# Patient Record
Sex: Female | Born: 1985 | Race: Black or African American | Hispanic: No | Marital: Single | Smoking: Never smoker
Health system: Southern US, Community
[De-identification: ages and names within clinical notes are randomized; demographics above are authoritative.]

## PROBLEM LIST (undated history)

## (undated) DIAGNOSIS — IMO0001 Reserved for inherently not codable concepts without codable children: Secondary | ICD-10-CM

## (undated) DIAGNOSIS — I2699 Other pulmonary embolism without acute cor pulmonale: Secondary | ICD-10-CM

## (undated) DIAGNOSIS — I82411 Acute embolism and thrombosis of right femoral vein: Secondary | ICD-10-CM

## (undated) HISTORY — PX: WISDOM TOOTH EXTRACTION: SHX21

---
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
## 2007-10-14 ENCOUNTER — Emergency Department: Payer: Self-pay | Admitting: Emergency Medicine

## 2008-05-04 ENCOUNTER — Emergency Department (HOSPITAL_COMMUNITY): Admission: EM | Admit: 2008-05-04 | Discharge: 2008-05-04 | Payer: Self-pay | Admitting: Emergency Medicine

## 2008-05-28 ENCOUNTER — Emergency Department (HOSPITAL_COMMUNITY): Admission: EM | Admit: 2008-05-28 | Discharge: 2008-05-28 | Payer: Self-pay | Admitting: Family Medicine

## 2008-10-01 ENCOUNTER — Emergency Department: Payer: Self-pay | Admitting: Emergency Medicine

## 2009-06-11 ENCOUNTER — Emergency Department (HOSPITAL_COMMUNITY): Admission: EM | Admit: 2009-06-11 | Discharge: 2009-06-11 | Payer: Self-pay | Admitting: Emergency Medicine

## 2010-05-29 LAB — CBC
MCHC: 34 g/dL (ref 30.0–36.0)
MCV: 99.2 fL (ref 78.0–100.0)
Platelets: 292 10*3/uL (ref 150–400)
RDW: 12.9 % (ref 11.5–15.5)

## 2010-05-29 LAB — LIPASE, BLOOD: Lipase: 26 U/L (ref 11–59)

## 2010-05-29 LAB — COMPREHENSIVE METABOLIC PANEL
AST: 18 U/L (ref 0–37)
Albumin: 3.2 g/dL — ABNORMAL LOW (ref 3.5–5.2)
CO2: 25 mEq/L (ref 19–32)
Calcium: 8.9 mg/dL (ref 8.4–10.5)
Creatinine, Ser: 0.82 mg/dL (ref 0.4–1.2)
GFR calc Af Amer: >60 mL/min (ref >60)
GFR calc non Af Amer: >60 mL/min (ref >60)
Sodium: 136 mEq/L (ref 135–145)
Total Protein: 7.1 g/dL (ref 6.0–8.3)

## 2010-05-29 LAB — URINALYSIS, ROUTINE W REFLEX MICROSCOPIC
Bilirubin Urine: NEGATIVE
Glucose, UA: NEGATIVE mg/dL
Nitrite: NEGATIVE
Specific Gravity, Urine: 1.025 (ref 1.005–1.030)
pH: 8 (ref 5.0–8.0)

## 2010-05-29 LAB — DIFFERENTIAL
Eosinophils Relative: 3 % (ref 0–5)
Lymphocytes Relative: 41 % (ref 12–46)
Lymphs Abs: 2.4 10*3/uL (ref 0.7–4.0)
Monocytes Relative: 7 % (ref 3–12)

## 2011-02-12 ENCOUNTER — Emergency Department: Payer: Self-pay

## 2011-05-13 ENCOUNTER — Emergency Department (INDEPENDENT_AMBULATORY_CARE_PROVIDER_SITE_OTHER)

## 2011-05-13 ENCOUNTER — Encounter (HOSPITAL_COMMUNITY): Payer: Self-pay | Admitting: Emergency Medicine

## 2011-05-13 ENCOUNTER — Emergency Department (INDEPENDENT_AMBULATORY_CARE_PROVIDER_SITE_OTHER)
Admission: EM | Admit: 2011-05-13 | Discharge: 2011-05-13 | Disposition: A | Discharge status: Home / Self Care | Source: Home / Self Care | Attending: Family Medicine | Admitting: Family Medicine

## 2011-05-13 DIAGNOSIS — M940 Chondrocostal junction syndrome [Tietze]: Secondary | ICD-10-CM

## 2011-05-13 LAB — POCT URINALYSIS DIP (DEVICE)
Bilirubin Urine: NEGATIVE
Glucose, UA: NEGATIVE mg/dL
Leukocytes, UA: NEGATIVE
Nitrite: NEGATIVE

## 2011-05-13 MED ORDER — NAPROXEN 500 MG PO TABS
500.0000 mg | ORAL_TABLET | Freq: Two times a day (BID) | ORAL | Status: DC
Start: 2011-05-13 — End: 2011-11-17

## 2011-05-13 MED ORDER — HYDROCODONE-ACETAMINOPHEN 5-325 MG PO TABS: ORAL_TABLET | ORAL | Status: AC

## 2011-05-13 NOTE — Discharge Instructions (Signed)
Your x-ray was negative for any acute findings. This is likely an inflammatory change in your rib cage. Take medications as directed. May use mild heat as well. Return to care should your symptoms not improve, or worsen in any way, such as fever, shortness of breath, worsening pain.

## 2011-05-13 NOTE — ED Notes (Signed)
Pain lower part of rib cage, on right .  Denies cough, denies injury.  Reported as a sharp pain.

## 2011-05-13 NOTE — ED Provider Notes (Signed)
History     CSN: [id number]  Arrival date & time 05/13/11  1526   First MD Initiated Contact with Patient 05/13/11 1638      Chief Complaint  Patient presents with  . Chest Pain    (Consider location/radiation/quality/duration/timing/severity/associated sxs/prior treatment) HPI Comments: Nashly presents for evaluation of right-sided lower rib cage pain. She denies any specific injury, did not herself did not fall. She also reports wrist into the pain as an appointment next week. For this. She denies any cough, fever. She denies any urinary symptoms. No dysuria. No hematuria. No frequency, or urgency. She, reports. She's been drinking lots of water, and cranberry juice in case. This was her kidneys.  Patient is a 26 y.o. female presenting with chest pain. The history is provided by the patient.  Chest Pain The chest pain began 1 - 2 weeks ago. Chest pain occurs constantly. The chest pain is unchanged. The pain is associated with breathing, lifting, coughing and exertion. The quality of the pain is described as pleuritic and sharp. The pain does not radiate. Chest pain is worsened by exertion and deep breathing. Pertinent negatives for primary symptoms include no fever, no fatigue, no cough, no wheezing, no palpitations, no nausea and no vomiting.     History reviewed. No pertinent past medical history.  History reviewed. No pertinent past surgical history.  History reviewed. No pertinent family history.  History  Substance Use Topics  . Smoking status: Never Smoker   . Smokeless tobacco: Not on file  . Alcohol Use: Yes    OB History    Grav Para Term Preterm Abortions TAB SAB Ect Mult Living                  Review of Systems  Constitutional: Negative.  Negative for fever and fatigue.  HENT: Positive for dental problem.        Wisdom teeth erupting  Eyes: Negative.   Respiratory: Negative.  Negative for cough and wheezing.   Cardiovascular: Negative for palpitations.       Rib pain  Gastrointestinal: Negative.  Negative for nausea and vomiting.  Genitourinary: Negative.   Musculoskeletal: Negative.   Skin: Negative.   Neurological: Negative.     Allergies  Triaminic  Home Medications   Current Outpatient Rx  Name Route Sig Dispense Refill  . IBUPROFEN 800 MG PO TABS Oral Take 800 mg by mouth every 8 (eight) hours as needed.    Colleen Can FE 1/20 PO Oral Take by mouth.    Marland Kitchen HYDROCODONE-ACETAMINOPHEN 5-325 MG PO TABS  Take one to two tablets every 4 to 6 hours as needed for pain 20 tablet 0  . NAPROXEN 500 MG PO TABS Oral Take 1 tablet (500 mg total) by mouth 2 (two) times daily. 30 tablet 0    BP 122/77  Pulse 66  Temp(Src) 98.6 F (37 C) (Oral)  Resp 16  SpO2 98%  LMP 05/12/2011  Physical Exam  Nursing note and vitals reviewed. Constitutional: She is oriented to person, place, and time. She appears well-developed and well-nourished.  HENT:  Head: Normocephalic and atraumatic.  Mouth/Throat: Uvula is midline, oropharynx is clear and moist and mucous membranes are normal.    Eyes: EOM are normal.  Neck: Normal range of motion.  Pulmonary/Chest: Effort normal and breath sounds normal. She has no decreased breath sounds. She has no wheezes. She has no rhonchi. She exhibits tenderness and bony tenderness.    Abdominal: Soft. Normal appearance and bowel  sounds are normal. There is no tenderness. There is no guarding and no CVA tenderness.  Musculoskeletal: Normal range of motion.  Neurological: She is alert and oriented to person, place, and time.  Skin: Skin is warm and dry.  Psychiatric: Her behavior is normal.    ED Course  Procedures (including critical care time)  Labs Reviewed  POCT URINALYSIS DIP (DEVICE) - Abnormal; Notable for the following:    Hgb urine dipstick LARGE (*)    All other components within normal limits  POCT PREGNANCY, URINE   Dg Ribs Unilateral W/chest Right  05/13/2011  *RADIOLOGY REPORT*  Clinical Data:  Right anterior rib and chest pain  RIGHT RIBS AND CHEST - 3+ VIEW  Comparison: None.  Findings: Normal heart size and vascularity.  Negative for pneumonia, collapse, consolidation, effusion, pneumothorax. Trachea midline.  Visualized ribs intact.  No displaced fracture or focal rib abnormality in the region of the radiopaque marker.  IMPRESSION: No acute finding.  Original Report Authenticated By: Judie Petit. Ruel Favors, M.D.     1. Costochondritis       MDM  Xray reviewed by radiologist and myself; negative; naproxen and hydrocodone PRN; return if no improvement        Richardo Priest, MD 05/13/11 1914

## 2011-11-17 ENCOUNTER — Emergency Department (INDEPENDENT_AMBULATORY_CARE_PROVIDER_SITE_OTHER)

## 2011-11-17 ENCOUNTER — Emergency Department (INDEPENDENT_AMBULATORY_CARE_PROVIDER_SITE_OTHER)
Admission: EM | Admit: 2011-11-17 | Discharge: 2011-11-17 | Disposition: A | Discharge status: Home / Self Care | Source: Home / Self Care | Attending: Emergency Medicine | Admitting: Emergency Medicine

## 2011-11-17 ENCOUNTER — Encounter (HOSPITAL_COMMUNITY): Payer: Self-pay | Admitting: *Deleted

## 2011-11-17 DIAGNOSIS — S93401A Sprain of unspecified ligament of right ankle, initial encounter: Secondary | ICD-10-CM

## 2011-11-17 DIAGNOSIS — S93409A Sprain of unspecified ligament of unspecified ankle, initial encounter: Secondary | ICD-10-CM

## 2011-11-17 MED ORDER — NAPROXEN 500 MG PO TABS: 500.0000 mg | ORAL_TABLET | Freq: Two times a day (BID) | ORAL | Status: AC

## 2011-11-17 MED ORDER — HYDROCODONE-ACETAMINOPHEN 5-325 MG PO TABS: 2.0000 | ORAL_TABLET | ORAL | Status: AC | PRN

## 2011-11-17 NOTE — ED Notes (Signed)
Pt  Reports  She  Injured  Her  r  Ankle  About  1  Week  Ago  She  Reports     She  Larey Seat   And  Perhaps  Twisted  The  Ankle   -  She  Reports  Pain on  Weight  Bearing         She  Reports  She  Has been  Applying ice  To  The  Affected  Area

## 2011-11-17 NOTE — ED Provider Notes (Signed)
History     CSN: [id number]  Arrival date & time 11/17/11  1430   First MD Initiated Contact with Patient 11/17/11 1432      Chief Complaint  Patient presents with  . Ankle Pain    (Consider location/radiation/quality/duration/timing/severity/associated sxs/prior treatment) HPI Comments: Patient states that she fell down the stairs at a week ago, rolling her right ankle outward. Do not hear a "pop". Reports mild lateral swelling. Which was elevating, going ice with improvement, but was required to run 2 miles yesterday for PT, and reports returned worsening pain lateral ankle and anterior foot. No nausea, vomiting, discoloration. No previous history of injury to his foot. Patient is not a smoker.  ROS as noted in HPI. All other ROS negative.   Patient is a 26 y.o. female presenting with ankle pain. The history is provided by the patient. No language interpreter was used.  Ankle Pain  The incident occurred at home. The injury mechanism was a fall. The pain is present in the left ankle. The quality of the pain is described as throbbing and aching. Associated symptoms include inability to bear weight. Pertinent negatives include no numbness, no loss of motion, no muscle weakness and no loss of sensation. She reports no foreign bodies present. The symptoms are aggravated by activity, bearing weight and palpation. She has tried rest for the symptoms. The treatment provided mild relief.    History reviewed. No pertinent past medical history.  History reviewed. No pertinent past surgical history.  History reviewed. No pertinent family history.  History  Substance Use Topics  . Smoking status: Never Smoker   . Smokeless tobacco: Not on file  . Alcohol Use: Yes    OB History    Grav Para Term Preterm Abortions TAB SAB Ect Mult Living                  Review of Systems  Neurological: Negative for numbness.    Allergies  Triaminic  Home Medications   Current Outpatient Rx    Name Route Sig Dispense Refill  . HYDROCODONE-ACETAMINOPHEN 5-325 MG PO TABS Oral Take 2 tablets by mouth every 4 (four) hours as needed for pain. 20 tablet 0  . NAPROXEN 500 MG PO TABS Oral Take 1 tablet (500 mg total) by mouth 2 (two) times daily. 20 tablet 0  . JUNEL FE 1/20 PO Oral Take by mouth.      BP 115/69  Pulse 69  Temp 98.1 F (36.7 C) (Oral)  Resp 20  SpO2 100%  LMP 11/05/2011  Physical Exam  Nursing note and vitals reviewed. Constitutional: She is oriented to person, place, and time. She appears well-developed and well-nourished. No distress.  HENT:  Head: Normocephalic and atraumatic.  Eyes: Conjunctivae and EOM are normal.  Neck: Normal range of motion.  Cardiovascular: Normal rate.   Pulmonary/Chest: Effort normal.  Abdominal: She exhibits no distension.  Musculoskeletal: Normal range of motion.       R lateral ligaments tender, ATFL tender, mild lateral ST S. Distal fibula NT , Medial malleolus NT,  Deltoid ligament NT ,Achilles NT, Proximal fibula NT, Proximal 5th metatarsal NT, Midfoot NT, distal NVI with baseline sensation / motor to foot with CR<2 seconds.   Neurological: She is alert and oriented to person, place, and time. Coordination normal.  Skin: Skin is warm and dry.  Psychiatric: She has a normal mood and affect. Her behavior is normal. Judgment and thought content normal.    ED Course  Procedures (  including critical care time)  Labs Reviewed - No data to display Dg Ankle Complete Right  11/17/2011  *RADIOLOGY REPORT*  Clinical Data: Recent fall with pain  RIGHT ANKLE - COMPLETE 3+ VIEW  Comparison: None.  Findings: The ankle joint appears normal.  Alignment is normal.  No fracture is seen.  IMPRESSION: Negative.   Original Report Authenticated By: Juline Patch, M.D.    Dg Foot Complete Right  11/17/2011  *RADIOLOGY REPORT*  Clinical Data: Larey Seat and injured right foot 1 week ago, persistent dorsal pain.  RIGHT FOOT COMPLETE - 3+ VIEW  Comparison:  None.  Findings: No evidence of acute or subacute fracture or dislocation. Well-preserved joint spaces.  Likely developmental irregularity involving the distal tuft of the distal phalanx of the great toe. Well-preserved bone mineral density.  No significant intrinsic osseous abnormalities.  IMPRESSION: No acute, subacute, or significant abnormalities.   Original Report Authenticated By: Arnell Sieving, M.D.      1. Right ankle sprain     MDM  I Imaging reviewed by myself. Report per radiologist. Applied ASO, crutches WBAT, instructed pt on ice, nsaid/ norco prn, and f/u with [facility] sports medicine clinic in 10 days if no improvement.     Luiz Blare, MD 11/17/11 [id number]

## 2012-01-05 ENCOUNTER — Encounter (HOSPITAL_COMMUNITY): Payer: Self-pay | Admitting: *Deleted

## 2012-01-05 ENCOUNTER — Emergency Department (HOSPITAL_COMMUNITY)
Admission: EM | Admit: 2012-01-05 | Discharge: 2012-01-05 | Disposition: A | Discharge status: Home / Self Care | Attending: Emergency Medicine | Admitting: Emergency Medicine

## 2012-01-05 DIAGNOSIS — F329 Major depressive disorder, single episode, unspecified: Secondary | ICD-10-CM | POA: Insufficient documentation

## 2012-01-05 DIAGNOSIS — Z79899 Other long term (current) drug therapy: Secondary | ICD-10-CM | POA: Insufficient documentation

## 2012-01-05 DIAGNOSIS — F3289 Other specified depressive episodes: Secondary | ICD-10-CM | POA: Insufficient documentation

## 2012-01-05 DIAGNOSIS — F32A Depression, unspecified: Secondary | ICD-10-CM

## 2012-01-05 DIAGNOSIS — R45851 Suicidal ideations: Secondary | ICD-10-CM | POA: Insufficient documentation

## 2012-01-05 LAB — RAPID URINE DRUG SCREEN, HOSP PERFORMED
Cocaine: NOT DETECTED
Opiates: NOT DETECTED

## 2012-01-05 MED ORDER — IBUPROFEN 100 MG/5ML PO SUSP: 600.0000 mg | Freq: Once | ORAL | Status: DC

## 2012-01-05 MED ORDER — ACETAMINOPHEN 325 MG PO TABS
650.0000 mg | ORAL_TABLET | ORAL | Status: DC | PRN
Start: 2012-01-05 — End: 2012-01-06

## 2012-01-05 MED ORDER — IBUPROFEN 200 MG PO TABS: 600.0000 mg | ORAL_TABLET | Freq: Three times a day (TID) | ORAL | Status: DC | PRN

## 2012-01-05 MED ORDER — ONDANSETRON HCL 4 MG PO TABS: 4.0000 mg | ORAL_TABLET | Freq: Three times a day (TID) | ORAL | Status: DC | PRN

## 2012-01-05 MED ORDER — ALUM & MAG HYDROXIDE-SIMETH 200-200-20 MG/5ML PO SUSP: 30.0000 mL | ORAL | Status: DC | PRN

## 2012-01-05 MED ORDER — IBUPROFEN 200 MG PO TABS
600.0000 mg | ORAL_TABLET | Freq: Once | ORAL | Status: AC
  Administered 2012-01-05: 600 mg via ORAL
  Filled 2012-01-05: qty 3

## 2012-01-05 NOTE — ED Provider Notes (Signed)
History     CSN: [id number]  Arrival date & time 01/05/12  1458   First MD Initiated Contact with Patient 01/05/12 1535      Chief Complaint  Patient presents with  . Medical Clearance  . Depression    (Consider location/radiation/quality/duration/timing/severity/associated sxs/prior treatment) The history is provided by the patient.   patient presents with some depression and some mild suicidal thoughts. She states his been going on for a few weeks. It started after she hurt her ankle and has been in a boot. She states she feels all alone. She states she's had some suicidal thoughts but would not know how to do it. It also started after she had been on pain medicine and also after she had a steroid shot into her foot. No previous depression history. No substance abuse. She denies possibility of pregnancy.  History reviewed. No pertinent past medical history.  History reviewed. No pertinent past surgical history.  No family history on file.  History  Substance Use Topics  . Smoking status: Never Smoker   . Smokeless tobacco: Not on file  . Alcohol Use: Yes    OB History    Grav Para Term Preterm Abortions TAB SAB Ect Mult Living                  Review of Systems  Constitutional: Negative for activity change and appetite change.  HENT: Negative for neck stiffness.   Eyes: Negative for pain.  Respiratory: Negative for chest tightness and shortness of breath.   Cardiovascular: Negative for chest pain and leg swelling.  Gastrointestinal: Negative for nausea, vomiting, abdominal pain and diarrhea.  Genitourinary: Negative for flank pain.  Musculoskeletal: Negative for back pain.  Skin: Negative for rash.  Neurological: Negative for weakness, numbness and headaches.  Psychiatric/Behavioral: Positive for suicidal ideas and dysphoric mood. Negative for behavioral problems.    Allergies  Triaminic  Home Medications   Current Outpatient Rx  Name Route Sig Dispense  Refill  . HYDROCODONE-ACETAMINOPHEN 5-325 MG PO TABS Oral Take 1 tablet by mouth every 6 (six) hours as needed. pain    . NAPROXEN 500 MG PO TABS Oral Take 1 tablet (500 mg total) by mouth 2 (two) times daily. 20 tablet 0  . TRAMADOL HCL 50 MG PO TABS Oral Take 100 mg by mouth every 6 (six) hours as needed. pain    . JUNEL FE 1/20 PO Oral Take by mouth.      BP 124/81  Pulse 77  Temp 98.2 F (36.8 C) (Oral)  Resp 20  SpO2 96%  Physical Exam  Nursing note and vitals reviewed. Constitutional: She is oriented to person, place, and time. She appears well-developed and well-nourished.  HENT:  Head: Normocephalic and atraumatic.  Cardiovascular: Normal rate, regular rhythm and normal heart sounds.   No murmur heard. Pulmonary/Chest: Effort normal and breath sounds normal. No respiratory distress. She has no wheezes. She has no rales.  Abdominal: Soft. Bowel sounds are normal. She exhibits no distension. There is no tenderness. There is no rebound and no guarding.  Musculoskeletal:       Right lower leg in walking boot.  Neurological: She is alert and oriented to person, place, and time. No cranial nerve deficit.  Skin: Skin is warm and dry.  Psychiatric: She has a normal mood and affect. Her speech is normal.    ED Course  Procedures (including critical care time)   Labs Reviewed  URINE RAPID DRUG SCREEN (HOSP PERFORMED)  PREGNANCY, URINE  ETHANOL   No results found.   1. Depression       MDM  Patient presents with new onset depression. Not actively suicidal. Patient has had a telepsych consult and patient has been cleared for discharge. She was given followup resources by the ACT team.        Juliet Rude. Rubin Payor, MD 01/05/12 2013

## 2012-01-05 NOTE — ED Notes (Signed)
Telepsych in progress. 

## 2012-01-05 NOTE — ED Notes (Signed)
Pt's father at bedside

## 2012-01-05 NOTE — ED Notes (Signed)
Multimedia programmer.

## 2012-01-05 NOTE — ED Notes (Signed)
Telepsych paperwork initiated.

## 2012-01-05 NOTE — ED Notes (Signed)
Pt reports feeling very alone over last few weeks. Fractured R ankle and has been in a boot x1 month. Feels angry, sad, frustrated. Sts her father checks on her, but her mother does not live around here. No other specific triggers for her depression. Pt brought in voluntarily by [employer].

## 2012-12-17 ENCOUNTER — Encounter (HOSPITAL_COMMUNITY): Payer: Self-pay | Admitting: Emergency Medicine

## 2012-12-17 DIAGNOSIS — R1012 Left upper quadrant pain: Secondary | ICD-10-CM | POA: Insufficient documentation

## 2012-12-17 DIAGNOSIS — Z79899 Other long term (current) drug therapy: Secondary | ICD-10-CM | POA: Insufficient documentation

## 2012-12-17 DIAGNOSIS — M25519 Pain in unspecified shoulder: Secondary | ICD-10-CM | POA: Insufficient documentation

## 2012-12-17 DIAGNOSIS — Z3202 Encounter for pregnancy test, result negative: Secondary | ICD-10-CM | POA: Insufficient documentation

## 2012-12-17 LAB — CBC WITH DIFFERENTIAL/PLATELET
Basophils Absolute: 0 10*3/uL (ref 0.0–0.1)
Basophils Relative: 0 % (ref 0–1)
Eosinophils Absolute: 0.1 10*3/uL (ref 0.0–0.7)
Eosinophils Relative: 1 % (ref 0–5)
HCT: 37.8 % (ref 36.0–46.0)
MCHC: 36.2 g/dL — ABNORMAL HIGH (ref 30.0–36.0)
MCV: 93.3 fL (ref 78.0–100.0)
Monocytes Absolute: 0.5 10*3/uL (ref 0.1–1.0)
RDW: 12.7 % (ref 11.5–15.5)

## 2012-12-17 LAB — COMPREHENSIVE METABOLIC PANEL
ALT: 11 U/L (ref 0–35)
Albumin: 3.5 g/dL (ref 3.5–5.2)
Alkaline Phosphatase: 39 U/L (ref 39–117)
Glucose, Bld: 79 mg/dL (ref 70–99)
Potassium: 4 mEq/L (ref 3.5–5.1)
Sodium: 138 mEq/L (ref 135–145)
Total Protein: 7.9 g/dL (ref 6.0–8.3)

## 2012-12-17 NOTE — ED Notes (Signed)
Presents with 2-3 week of left upper quadrant pain descriebd as sharp with radiation to right shoulder. Pain was intermittent but has become constant. Denies nausea, denies chest pain.

## 2012-12-18 ENCOUNTER — Emergency Department (HOSPITAL_COMMUNITY)
Admission: EM | Admit: 2012-12-18 | Discharge: 2012-12-18 | Disposition: A | Discharge status: Home / Self Care | Attending: Emergency Medicine | Admitting: Emergency Medicine

## 2012-12-18 ENCOUNTER — Emergency Department (HOSPITAL_COMMUNITY)

## 2012-12-18 DIAGNOSIS — M25511 Pain in right shoulder: Secondary | ICD-10-CM

## 2012-12-18 DIAGNOSIS — R109 Unspecified abdominal pain: Secondary | ICD-10-CM

## 2012-12-18 LAB — URINALYSIS, ROUTINE W REFLEX MICROSCOPIC
Bilirubin Urine: NEGATIVE
Ketones, ur: 15 mg/dL — AB
Nitrite: NEGATIVE
Urobilinogen, UA: 0.2 mg/dL (ref 0.0–1.0)

## 2012-12-18 MED ORDER — IOHEXOL 300 MG/ML  SOLN
100.0000 mL | Freq: Once | INTRAMUSCULAR | Status: AC | PRN
  Administered 2012-12-18: 100 mL via INTRAVENOUS

## 2012-12-18 MED ORDER — IOHEXOL 300 MG/ML  SOLN
25.0000 mL | Freq: Once | INTRAMUSCULAR | Status: AC | PRN
  Administered 2012-12-18: 25 mL via ORAL

## 2012-12-18 MED ORDER — MORPHINE SULFATE 4 MG/ML IJ SOLN
4.0000 mg | Freq: Once | INTRAMUSCULAR | Status: AC
  Administered 2012-12-18: 4 mg via INTRAVENOUS
  Filled 2012-12-18: qty 1

## 2012-12-18 MED ORDER — HYDROCODONE-ACETAMINOPHEN 5-325 MG PO TABS: 1.0000 | ORAL_TABLET | Freq: Four times a day (QID) | ORAL | Status: DC | PRN

## 2012-12-18 MED ORDER — SODIUM CHLORIDE 0.9 % IV BOLUS (SEPSIS)
1000.0000 mL | Freq: Once | INTRAVENOUS | Status: AC
  Administered 2012-12-18: 1000 mL via INTRAVENOUS

## 2012-12-18 MED ORDER — IBUPROFEN 400 MG PO TABS: 400.0000 mg | ORAL_TABLET | Freq: Four times a day (QID) | ORAL | Status: DC | PRN

## 2012-12-18 MED ORDER — ONDANSETRON 8 MG PO TBDP: 8.0000 mg | ORAL_TABLET | Freq: Three times a day (TID) | ORAL | Status: DC | PRN

## 2012-12-18 MED ORDER — ONDANSETRON HCL 4 MG/2ML IJ SOLN
4.0000 mg | Freq: Once | INTRAMUSCULAR | Status: AC
  Administered 2012-12-18: 4 mg via INTRAVENOUS
  Filled 2012-12-18: qty 2

## 2012-12-18 NOTE — ED Notes (Signed)
Swollen abdomen to LUQ, pt denies nausea at this time. States she has LUQ pain and R shoulder pain. LUQ edema noted upon palpation.

## 2012-12-18 NOTE — ED Provider Notes (Signed)
CSN: [id number]     Arrival date & time 12/17/12  2043 History   First MD Initiated Contact with Patient 12/18/12 0049     Chief Complaint  Patient presents with  . Abdominal Pain   (Consider location/radiation/quality/duration/timing/severity/associated sxs/prior Treatment) HPI Comments: Pt comes in with cc of abd pain, shoulder pain. 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 She comes in with 2-3 weeks of left sided abd pain and about 3 days hx of shoulder pain. The left sided abd pain is sharp, non radiating. There is no uti like sx, no hx of renal stones, no vaginal discharge, bleeding, no hx of pelbic disordered. Pt has no hx of similar pain. She denies any cough. Pt also has right shoulder pain. Pain is sharp, worse with movement. No cough. No hx of PE, DVT and no risk factors for the same. No trauma. Pt has no hx of ivda. Able to move the shoulder, just has pain.  Patient is a 27 y.o. female presenting with abdominal pain. The history is provided by the patient.  Abdominal Pain Associated symptoms: no chest pain, no chills, no constipation, no cough, no diarrhea, no fever, no hematuria, no nausea, no shortness of breath and no vomiting     History reviewed. No pertinent past medical history. History reviewed. No pertinent past surgical history. History reviewed. No pertinent family history. History  Substance Use Topics  . Smoking status: Never Smoker   . Smokeless tobacco: Not on file  . Alcohol Use: Yes   OB History   Grav Para Term Preterm Abortions TAB SAB Ect Mult Living                 Review of Systems  Constitutional: Negative for fever, chills and activity change.  HENT: Negative for facial swelling.   Respiratory: Negative for cough, shortness of breath and wheezing.   Cardiovascular: Negative for chest pain.  Gastrointestinal: Positive for abdominal pain. Negative for nausea, vomiting, diarrhea,  constipation, blood in stool and abdominal distention.  Genitourinary: Negative for hematuria and difficulty urinating.  Musculoskeletal: Positive for arthralgias. Negative for joint swelling and neck pain.  Skin: Negative for color change, rash and wound.  Neurological: Negative for speech difficulty.  Hematological: Does not bruise/bleed easily.  Psychiatric/Behavioral: Negative for confusion.    Allergies  Triaminic  Home Medications   Current Outpatient Rx  Name  Route  Sig  Dispense  Refill  . Norethin Ace-Eth Estrad-FE (JUNEL FE 1/20 PO)   Oral   Take 1 tablet by mouth daily.           BP 101/58  Pulse 61  Temp(Src) 98.4 F (36.9 C) (Oral)  Resp 16  SpO2 99%  LMP 12/11/2012 Physical Exam  Nursing note and vitals reviewed. Constitutional: She is oriented to person, place, and time. She appears well-developed and well-nourished.  HENT:  Head: Normocephalic and atraumatic.  Eyes: EOM are normal. Pupils are equal, round, and reactive to light.  Neck: Neck supple.  Cardiovascular: Normal rate, regular rhythm and normal heart sounds.   No murmur heard. Pulmonary/Chest: Effort normal. No respiratory distress.  Abdominal: Soft. She exhibits no distension. There is tenderness. There is no rebound and no guarding.  LUQ tenderness, + guarding, no rebound  Musculoskeletal:  No left shoulder swelling ,eryrthema, callor. Able to abduct and strength is 4+/5  Neurological: She is alert and oriented to person, place, and time.  Skin: Skin is warm and dry.    ED Course  Procedures (including critical care time) Labs Review Labs Reviewed  COMPREHENSIVE METABOLIC PANEL - Abnormal; Notable for the following:    GFR calc non Af Amer 76 (*)    GFR calc Af Amer 88 (*)    All other components within normal limits  CBC WITH DIFFERENTIAL - Abnormal; Notable for the following:    MCHC 36.2 (*)    All other components within normal limits  URINALYSIS, ROUTINE W REFLEX MICROSCOPIC  - Abnormal; Notable for the following:    Color, Urine AMBER (*)    APPearance CLOUDY (*)    Specific Gravity, Urine 1.034 (*)    Ketones, ur 15 (*)    All other components within normal limits  LIPASE, BLOOD  POCT PREGNANCY, URINE   Imaging Review Dg Chest 1 View  12/18/2012   CLINICAL DATA:  Shoulder and abdominal pain.  EXAM: CHEST - 1 VIEW  COMPARISON:  05/13/2011.  FINDINGS: Normal sized heart. Clear lungs. Stable mild scoliosis. Excreted contrast in the renal collecting systems.  IMPRESSION: No acute abnormality.   Electronically Signed   By: Gordan Payment M.D.   On: 12/18/2012 02:57   Dg Shoulder Right  12/18/2012   CLINICAL DATA:  Right posterior shoulder pain.  No known injury.  EXAM: RIGHT SHOULDER - 2+ VIEW  COMPARISON:  None.  FINDINGS: There is no evidence of fracture or dislocation. There is no evidence of arthropathy or other focal bone abnormality. Soft tissues are unremarkable.  IMPRESSION: Normal examination.   Electronically Signed   By: Gordan Payment M.D.   On: 12/18/2012 02:58   Ct Abdomen Pelvis W Contrast  12/18/2012   *RADIOLOGY REPORT*  Clinical Data: 2-3 weeks of intermittent left upper quadrant pain.  CT ABDOMEN AND PELVIS WITH CONTRAST  Technique:  Multidetector CT imaging of the abdomen and pelvis was performed following the standard protocol during bolus administration of intravenous contrast.  Contrast: OMNIPAQUE IOHEXOL 300 MG/ML  SOLN  Comparison: None.  Findings: The visualized lung bases are clear.  The liver demonstrates a normal contrast enhanced appearance.  The gallbladder is within normal limits.  The spleen, adrenal glands, and pancreas are unremarkable.  The kidneys are symmetric in size with symmetric enhancement.  No hydronephrosis, nephrolithiasis, or focal renal mass.  The stomach is unremarkable.  There is no evidence of bowel obstruction. The visualized appendix is of normal caliber and appearance without associate inflammatory changes to suggest  acute appendicitis.  No abnormal wall thickening or enhancement is seen about the bowels to suggest underlying inflammation.  A moderate amount retained stool seen diffusely throughout the colon.  Bladder is partially decompressed but grossly normal.  Uterus and ovaries are within normal limits for patient age.  Small amount of free fluid is seen within the right pelvic cul-de- sac, which demonstrates a density of 15 HU, likely physiologic.  No free intraperitoneal air identified.  No pathologically enlarged intra-abdominal or pelvic lymph nodes are seen.  Normal intravascular enhancement is seen throughout the abdomen and pelvis.  No acute osseous abnormality identified.  No focal osseous lesions.  IMPRESSION:  1.  No CT evidence of acute intra-abdominal or pelvic process identified. 2.  Small volume free fluid within the pelvis, likely physiologic.   Original Report Authenticated By: Rise Mu, M.D.    EKG Interpretation   None       MDM  No diagnosis found.  Pt comes in with cc  of Left sided abd pain x 3 weeks and right shoulder pain x 3 days.  She mentioned in triage that the abd pain radiates to the shoulder, but tells me that they are 2 separate  Pains. She has no medical hx, no ivda.  Shoulder exam reveals no signs of infection - swelling, redness, warmth - and more importantly she is able to move her shoulder quite well - just with mild pain.  Abd pain is severe on palpation. CT abd ordered, and is neg.  Pain x 3 weeks, doubt torsion. No splenomegaly, no renal stones, no infections.  Spoke with [facility], no large cyst seen either.  Pt hasn o lower quadrant pain, and so no indication for US pelvis or pelvic exam at this time.  Results discussed with the patient. Will d.c  She has insurance, pcp f/u will be provided.     Derwood Kaplan, MD 12/18/12 [phone]

## 2013-06-28 ENCOUNTER — Encounter (HOSPITAL_COMMUNITY): Payer: Self-pay | Admitting: Emergency Medicine

## 2013-06-28 ENCOUNTER — Emergency Department (HOSPITAL_COMMUNITY)

## 2013-06-28 ENCOUNTER — Emergency Department (HOSPITAL_COMMUNITY)
Admission: EM | Admit: 2013-06-28 | Discharge: 2013-06-28 | Disposition: A | Discharge status: Home / Self Care | Attending: Emergency Medicine | Admitting: Emergency Medicine

## 2013-06-28 DIAGNOSIS — Y939 Activity, unspecified: Secondary | ICD-10-CM | POA: Insufficient documentation

## 2013-06-28 DIAGNOSIS — M546 Pain in thoracic spine: Secondary | ICD-10-CM | POA: Diagnosis present

## 2013-06-28 DIAGNOSIS — M542 Cervicalgia: Secondary | ICD-10-CM | POA: Insufficient documentation

## 2013-06-28 DIAGNOSIS — Z888 Allergy status to other drugs, medicaments and biological substances status: Secondary | ICD-10-CM | POA: Insufficient documentation

## 2013-06-28 DIAGNOSIS — M549 Dorsalgia, unspecified: Secondary | ICD-10-CM

## 2013-06-28 DIAGNOSIS — R51 Headache: Secondary | ICD-10-CM | POA: Insufficient documentation

## 2013-06-28 MED ORDER — CYCLOBENZAPRINE HCL 5 MG PO TABS: 5.0000 mg | ORAL_TABLET | Freq: Three times a day (TID) | ORAL | Status: DC | PRN

## 2013-06-28 MED ORDER — NAPROXEN 500 MG PO TABS: 500.0000 mg | ORAL_TABLET | Freq: Two times a day (BID) | ORAL | Status: DC

## 2013-06-28 MED ORDER — ACETAMINOPHEN 325 MG PO TABS
650.0000 mg | ORAL_TABLET | Freq: Once | ORAL | Status: AC
  Administered 2013-06-28: 650 mg via ORAL
  Filled 2013-06-28: qty 2
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
## 2013-06-28 NOTE — ED Notes (Signed)
Patient is watching TV with her family.  No s/sx of distress.  Aware of plan for xray

## 2013-06-28 NOTE — Discharge Instructions (Signed)
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   [facility]   [address] [place], [place] [phone]   [facility] [address], [place], [place] [phone] Insurance/Medicaid/sponsorship through [facility] and Families [address]                                    [place], [place] [phone] Therapy/tele-psych/case  [facility] [address], [place] [phone]    Dr. Lolly Mustache  [phone]   [facility]  [employer] [facility] Dept. 1) [address] [place] [address] [place] 2) [address], [place] 3)  [address], [place] [phone]  [phone]   [facility] Child Abuse Hotline [phone] or [phone] (After Hours)

## 2013-06-28 NOTE — ED Notes (Signed)
Patient has returned from xray.  Reports she is feeling better.
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
## 2013-07-02 NOTE — ED Provider Notes (Signed)
Medical screening examination/treatment/procedure(s) were performed by non-physician practitioner and as supervising physician I was immediately available for consultation/collaboration.  Presly Steinruck Y. Claressa Hughley, MD 07/02/13 1416 

## 2013-10-24 ENCOUNTER — Encounter (HOSPITAL_COMMUNITY): Payer: Self-pay | Admitting: Emergency Medicine

## 2013-10-24 ENCOUNTER — Emergency Department (INDEPENDENT_AMBULATORY_CARE_PROVIDER_SITE_OTHER)
Admission: EM | Admit: 2013-10-24 | Discharge: 2013-10-24 | Disposition: A | Discharge status: Home / Self Care | Source: Home / Self Care | Attending: Emergency Medicine | Admitting: Emergency Medicine

## 2013-10-24 DIAGNOSIS — IMO0002 Reserved for concepts with insufficient information to code with codable children: Secondary | ICD-10-CM

## 2013-10-24 DIAGNOSIS — W268XXA Contact with other sharp object(s), not elsewhere classified, initial encounter: Secondary | ICD-10-CM

## 2013-10-24 DIAGNOSIS — T148XXA Other injury of unspecified body region, initial encounter: Secondary | ICD-10-CM

## 2013-10-24 MED ORDER — MUPIROCIN 2 % EX OINT
1.0000 | TOPICAL_OINTMENT | Freq: Three times a day (TID) | CUTANEOUS | Status: DC
Start: 2013-10-24 — End: 2013-12-22
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
## 2013-10-24 NOTE — Discharge Instructions (Signed)

## 2013-10-24 NOTE — ED Provider Notes (Signed)
  Chief Complaint   Chief Complaint  Patient presents with  . Laceration    History of Present Illness   [name] is a 28 year old female who scratched her right index finger on a hanger at work 3 days ago. She sustained a small scratch over the dorsal mid phalanx of her right index finger. This is not swollen, painful, or draining pus. She's able to move all joints well.  Review of Systems   Other than as noted above, the patient denies any of the following symptoms: Musculoskeletal:  No joint pain or decreased range of motion. Neuro:  No numbness, tingling, or weakness.  PMFSH   Past medical history, family history, social history, meds, and allergies were reviewed.   Physical Examination     Vital signs:  BP 120/79  Pulse 71  Temp(Src) 98.7 F (37.1 C) (Oral)  Resp 16  SpO2 97%  LMP 10/10/2013 Ext:  She is a very shallow laceration over the dorsal mid phalanx of the right index finger. This does not involve the tendon. Extensor mechanism is intact.  All other joints had a full ROM without pain.  Pulses were full.  Good capillary refill in all digits.  No edema. Neurological:  Alert and oriented.  No muscle weakness.  Sensation was intact to light touch.   Course in Urgent Care Center   Antibiotic ointment and a Band-Aid dressing were applied and the finger was splinted in a position of function.  Assessment   The encounter diagnosis was Laceration.  No evidence of infection.  Plan   1.  Meds:  The following meds were prescribed:   Discharge Medication List as of 10/24/2013  8:40 AM    START taking these medications   Details  mupirocin ointment (BACTROBAN) 2 % Apply 1 application topically 3 (three) times daily., Starting 10/24/2013, Until Discontinued, Normal        2.  Patient Education/Counseling:  The patient was given appropriate handouts, self care instructions, and instructed in symptomatic relief. Instructions were given for wound care.    3.   Follow up:  The patient was told to follow up immediately if there is any sign of infection.   Reuben Likesavid C Gaspar Fowle, MD 10/24/13 [phone]

## 2013-12-03 ENCOUNTER — Emergency Department (HOSPITAL_COMMUNITY)
Admission: EM | Admit: 2013-12-03 | Discharge: 2013-12-03 | Disposition: A | Discharge status: Home / Self Care | Attending: Emergency Medicine | Admitting: Emergency Medicine

## 2013-12-03 ENCOUNTER — Encounter (HOSPITAL_COMMUNITY): Payer: Self-pay | Admitting: Emergency Medicine

## 2013-12-03 ENCOUNTER — Emergency Department (HOSPITAL_COMMUNITY)

## 2013-12-03 DIAGNOSIS — Z79899 Other long term (current) drug therapy: Secondary | ICD-10-CM | POA: Diagnosis not present

## 2013-12-03 DIAGNOSIS — Y9289 Other specified places as the place of occurrence of the external cause: Secondary | ICD-10-CM | POA: Diagnosis not present

## 2013-12-03 DIAGNOSIS — S99919A Unspecified injury of unspecified ankle, initial encounter: Secondary | ICD-10-CM | POA: Diagnosis not present

## 2013-12-03 DIAGNOSIS — S8991XA Unspecified injury of right lower leg, initial encounter: Secondary | ICD-10-CM

## 2013-12-03 DIAGNOSIS — Y9389 Activity, other specified: Secondary | ICD-10-CM | POA: Diagnosis not present

## 2013-12-03 DIAGNOSIS — Y990 Civilian activity done for income or pay: Secondary | ICD-10-CM | POA: Insufficient documentation

## 2013-12-03 DIAGNOSIS — X500XXA Overexertion from strenuous movement or load, initial encounter: Secondary | ICD-10-CM | POA: Insufficient documentation

## 2013-12-03 DIAGNOSIS — S8990XA Unspecified injury of unspecified lower leg, initial encounter: Secondary | ICD-10-CM | POA: Diagnosis present

## 2013-12-03 DIAGNOSIS — S99929A Unspecified injury of unspecified foot, initial encounter: Principal | ICD-10-CM

## 2013-12-03 DIAGNOSIS — Z791 Long term (current) use of non-steroidal anti-inflammatories (NSAID): Secondary | ICD-10-CM | POA: Insufficient documentation

## 2013-12-03 DIAGNOSIS — M79661 Pain in right lower leg: Secondary | ICD-10-CM

## 2013-12-03 MED ORDER — HYDROCODONE-ACETAMINOPHEN 5-325 MG PO TABS
1.0000 | ORAL_TABLET | Freq: Once | ORAL | Status: AC
  Administered 2013-12-03: 1 via ORAL
  Filled 2013-12-03: qty 1

## 2013-12-03 MED ORDER — IBUPROFEN 800 MG PO TABS
800.0000 mg | ORAL_TABLET | Freq: Once | ORAL | Status: AC
  Administered 2013-12-03: 800 mg via ORAL
  Filled 2013-12-03: qty 1

## 2013-12-03 MED ORDER — HYDROCODONE-ACETAMINOPHEN 5-325 MG PO TABS: 1.0000 | ORAL_TABLET | Freq: Four times a day (QID) | ORAL | Status: DC | PRN

## 2013-12-03 MED ORDER — IBUPROFEN 800 MG PO TABS: 800.0000 mg | ORAL_TABLET | Freq: Three times a day (TID) | ORAL | Status: DC | PRN
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
## 2013-12-03 NOTE — ED Notes (Signed)
Ortho tech at bedside 

## 2013-12-03 NOTE — ED Notes (Signed)
Pt has a ride home.  

## 2013-12-03 NOTE — Discharge Instructions (Signed)
The x-rays were normal. Ice and elevate your leg. Follow up with the orthopedic doctor provided.

## 2013-12-03 NOTE — ED Notes (Signed)
Pt BIB EMS. Pt was at work hanging up a dress and experienced pain in her R calf which radiates behind R knee. Unable to bear wt on R leg. Unable to straighten leg. Rates pain 10/10.

## 2013-12-03 NOTE — ED Notes (Signed)
Patient transported to X-ray 

## 2013-12-03 NOTE — ED Notes (Signed)
Ortho tech called for application of knee sleeve and crutches.  

## 2013-12-08 DIAGNOSIS — I82411 Acute embolism and thrombosis of right femoral vein: Secondary | ICD-10-CM

## 2013-12-08 DIAGNOSIS — I2699 Other pulmonary embolism without acute cor pulmonale: Secondary | ICD-10-CM

## 2013-12-08 HISTORY — DX: Acute embolism and thrombosis of right femoral vein: I82.411

## 2013-12-08 HISTORY — DX: Other pulmonary embolism without acute cor pulmonale: I26.99

## 2013-12-08 NOTE — ED Provider Notes (Signed)
Medical screening examination/treatment/procedure(s) were performed by non-physician practitioner and as supervising physician I was immediately available for consultation/collaboration.   EKG Interpretation None       Taytum Scheck, MD 12/08/13 1344 

## 2013-12-22 ENCOUNTER — Inpatient Hospital Stay (HOSPITAL_COMMUNITY)
Admission: AD | Admit: 2013-12-22 | Discharge: 2013-12-27 | DRG: 252 | Disposition: A | Discharge status: Home / Self Care | Source: Ambulatory Visit | Attending: Internal Medicine | Admitting: Internal Medicine

## 2013-12-22 DIAGNOSIS — I82419 Acute embolism and thrombosis of unspecified femoral vein: Secondary | ICD-10-CM | POA: Diagnosis present

## 2013-12-22 DIAGNOSIS — M23202 Derangement of unspecified lateral meniscus due to old tear or injury, unspecified knee: Secondary | ICD-10-CM | POA: Diagnosis present

## 2013-12-22 DIAGNOSIS — I82401 Acute embolism and thrombosis of unspecified deep veins of right lower extremity: Principal | ICD-10-CM

## 2013-12-22 DIAGNOSIS — S83251A Bucket-handle tear of lateral meniscus, current injury, right knee, initial encounter: Secondary | ICD-10-CM | POA: Diagnosis present

## 2013-12-22 DIAGNOSIS — I2699 Other pulmonary embolism without acute cor pulmonale: Secondary | ICD-10-CM | POA: Diagnosis present

## 2013-12-22 DIAGNOSIS — M25561 Pain in right knee: Secondary | ICD-10-CM | POA: Diagnosis present

## 2013-12-22 DIAGNOSIS — M79604 Pain in right leg: Secondary | ICD-10-CM | POA: Diagnosis present

## 2013-12-22 DIAGNOSIS — Z7901 Long term (current) use of anticoagulants: Secondary | ICD-10-CM

## 2013-12-22 DIAGNOSIS — I82411 Acute embolism and thrombosis of right femoral vein: Secondary | ICD-10-CM

## 2013-12-22 DIAGNOSIS — S83251D Bucket-handle tear of lateral meniscus, current injury, right knee, subsequent encounter: Secondary | ICD-10-CM

## 2013-12-22 DIAGNOSIS — I82409 Acute embolism and thrombosis of unspecified deep veins of unspecified lower extremity: Secondary | ICD-10-CM | POA: Diagnosis present

## 2013-12-22 LAB — CBC
HEMATOCRIT: 39.6 % (ref 36.0–46.0)
Hemoglobin: 13.5 g/dL (ref 12.0–15.0)
MCH: 32 pg (ref 26.0–34.0)
MCHC: 34.1 g/dL (ref 30.0–36.0)
MCV: 93.8 fL (ref 78.0–100.0)
Platelets: 427 10*3/uL — ABNORMAL HIGH (ref 150–400)
RBC: 4.22 MIL/uL (ref 3.87–5.11)
RDW: 12.5 % (ref 11.5–15.5)
WBC: 8.3 10*3/uL (ref 4.0–10.5)

## 2013-12-22 LAB — COMPREHENSIVE METABOLIC PANEL
ALT: 9 U/L (ref 0–35)
ANION GAP: 13 (ref 5–15)
AST: 16 U/L (ref 0–37)
Albumin: 3 g/dL — ABNORMAL LOW (ref 3.5–5.2)
Alkaline Phosphatase: 38 U/L — ABNORMAL LOW (ref 39–117)
BUN: 13 mg/dL (ref 6–23)
CHLORIDE: 99 meq/L (ref 96–112)
CO2: 24 meq/L (ref 19–32)
CREATININE: 0.87 mg/dL (ref 0.50–1.10)
Calcium: 9.7 mg/dL (ref 8.4–10.5)
GFR calc Af Amer: >90 mL/min (ref >90)
GLUCOSE: 100 mg/dL — AB (ref 70–99)
Potassium: 3.5 mEq/L — ABNORMAL LOW (ref 3.7–5.3)
Sodium: 136 mEq/L — ABNORMAL LOW (ref 137–147)
Total Bilirubin: <0.2 mg/dL — ABNORMAL LOW (ref 0.3–1.2)
Total Protein: 8.2 g/dL (ref 6.0–8.3)

## 2013-12-22 LAB — PHOSPHORUS: Phosphorus: 3.2 mg/dL (ref 2.3–4.6)

## 2013-12-22 LAB — APTT: aPTT: 33 seconds (ref 24–37)

## 2013-12-22 LAB — MAGNESIUM: Magnesium: 2 mg/dL (ref 1.5–2.5)

## 2013-12-22 MED ORDER — ONDANSETRON HCL 4 MG PO TABS: 4.0000 mg | ORAL_TABLET | Freq: Four times a day (QID) | ORAL | Status: DC | PRN

## 2013-12-22 MED ORDER — OXYCODONE HCL 5 MG PO TABS
5.0000 mg | ORAL_TABLET | ORAL | Status: DC | PRN
  Administered 2013-12-22 – 2013-12-26 (×10): 5 mg via ORAL
  Filled 2013-12-22 (×11): qty 1

## 2013-12-22 MED ORDER — CYCLOBENZAPRINE HCL 10 MG PO TABS: 5.0000 mg | ORAL_TABLET | Freq: Three times a day (TID) | ORAL | Status: DC | PRN

## 2013-12-22 MED ORDER — SODIUM CHLORIDE 0.9 % IV SOLN: 250.0000 mL | INTRAVENOUS | Status: DC | PRN

## 2013-12-22 MED ORDER — ZOLPIDEM TARTRATE 5 MG PO TABS
5.0000 mg | ORAL_TABLET | Freq: Every evening | ORAL | Status: DC | PRN
  Administered 2013-12-22 – 2013-12-25 (×4): 5 mg via ORAL
  Filled 2013-12-22 (×4): qty 1

## 2013-12-22 MED ORDER — NORETHIN ACE-ETH ESTRAD-FE 1-20 MG-MCG PO TABS: 1.0000 | ORAL_TABLET | Freq: Every day | ORAL | Status: DC

## 2013-12-22 MED ORDER — MORPHINE SULFATE 2 MG/ML IJ SOLN
1.0000 mg | INTRAMUSCULAR | Status: DC | PRN
  Administered 2013-12-26: 1 mg via INTRAVENOUS
  Filled 2013-12-22: qty 1

## 2013-12-22 MED ORDER — SODIUM CHLORIDE 0.9 % IJ SOLN: 3.0000 mL | INTRAMUSCULAR | Status: DC | PRN

## 2013-12-22 MED ORDER — HEPARIN (PORCINE) IN NACL 100-0.45 UNIT/ML-% IJ SOLN
1100.0000 [IU]/h | INTRAMUSCULAR | Status: AC
  Administered 2013-12-22: 1100 [IU]/h via INTRAVENOUS
  Filled 2013-12-22 (×2): qty 250

## 2013-12-22 MED ORDER — SODIUM CHLORIDE 0.9 % IJ SOLN
3.0000 mL | Freq: Two times a day (BID) | INTRAMUSCULAR | Status: DC
Start: 2013-12-22 — End: 2013-12-27
  Administered 2013-12-24 – 2013-12-27 (×3): 3 mL via INTRAVENOUS

## 2013-12-22 MED ORDER — ONDANSETRON HCL 4 MG/2ML IJ SOLN: 4.0000 mg | Freq: Four times a day (QID) | INTRAMUSCULAR | Status: DC | PRN

## 2013-12-22 MED ORDER — SODIUM CHLORIDE 0.9 % IJ SOLN
3.0000 mL | Freq: Two times a day (BID) | INTRAMUSCULAR | Status: DC
  Administered 2013-12-22 – 2013-12-26 (×6): 3 mL via INTRAVENOUS

## 2013-12-22 NOTE — Progress Notes (Signed)
[name] [id number] Code Status: Full   Admission Data: 12/22/2013 6:11 PM Attending Provider:  York SpanielBuriev  PCP:No PCP Per Patient Consults/ Treatment Team:    [name] is a 28 y.o. female patient admitted from ED awake, alert - oriented  X 3 - no acute distress noted.  VSS - Last menstrual period 11/04/2013.    IV in place, occlusive dsg intact without redness.  Orientation to room, and floor completed with information packet given to patient/family. Admission INP armband ID verified with patient/family, and in place.   SR up x 2, fall assessment complete, with patient and family able to verbalize understanding of risk associated with falls, and verbalized understanding to call nsg before up out of bed.  Call light within reach, patient able to voice, and demonstrate understanding.       Will cont to eval and treat per MD orders.  Al DecantFlores, Halyn Flaugher F, [place] 12/22/2013 6:11 PM

## 2013-12-22 NOTE — Progress Notes (Addendum)
ANTICOAGULATION CONSULT NOTE - Initial Consult  Pharmacy Consult for heparin Indication: pulmonary embolus  Allergies  Allergen Reactions  . Triaminic Anaphylaxis and Hives    Patient Measurements: Height: 5\' 6"  (167.6 cm) Weight: 176 lb 11.2 oz (80.151 kg) IBW/kg (Calculated) : 59.3 Heparin Dosing Weight: 75kg  Vital Signs: Temp: 98.8 F (37.1 C) (10/15 1805) Temp Source: Oral (10/15 1805) BP: 116/78 mmHg (10/15 1805) Pulse Rate: 85 (10/15 1805)  Labs: No results found for this basename: HGB, HCT, PLT, APTT, LABPROT, INR, HEPARINUNFRC, CREATININE, CKTOTAL, CKMB, TROPONINI,  in the last 72 hours  Estimated Creatinine Clearance: 90.1 ml/min (by C-G formula based on Cr of 1.02).   Medical History: No past medical history on file.   Assessment: 27 YOF who [facility] and diagnosed with a DVT in her right leg from femoral to popliteal artery found on MRI. She was started on Xarelto 15mg  BIDWC and then followed up with the same clinic today after a confirmatory ultrasound. Today is was also discovered on CT angio that she has a PE in her right lower lobe. Bethanie did tell me that she has had SOB for about 2 weeks and this is also noted in [facility] notes. She endorses that she has been taking the Xarelto with food and has not missed a dose since starting the medication.  She has stopped taking her birth control Colleen Can(Junel 1/20 FE) on Monday after instructions from [facility] since estrogens are known to cause clots and contraindicated with known clot- medication discontinued from PTA list and removed from current active medications.  Per Dr. Sharyon MedicusHijazi, after discussion with Dr. Arlice ColtMagrinot, will start IV heparin and re-evaluate in the morning for long term plans. To start heparin ~15 hours from last dose rather than full 24 as per typical protocol. Also no bolus and to aim for lower heparin goal.  Goal of Therapy:  Heparin level 0.3 units/ml aPTT 66 seconds Monitor  platelets by anticoagulation protocol: Yes   Plan:  1. Start heparin this evening at 2300 at 1100 units/hr- spoke with RN about this plan and she understands and will hand off to evening shift RN as well 2. Heparin level and aPTT with AM labs 3. Follow evaluation in the morning and long-term plans for anticoagulation  Melburn Treiber D. Lenda Baratta, PharmD, BCPS Clinical Pharmacist Pager: [phone] 12/22/2013 7:58 PM

## 2013-12-22 NOTE — H&P (Signed)
[facility]                                                                                    Patient Demographics  [name], is a 28 y.o. female  CSN: [id number]  MRN: [id number]  DOB - 04/19/1985  Admit Date - 12/22/2013  Outpatient Primary MD for the patient is No PCP Per Patient   With History of -  No past medical history on file.    No past surgical history on file.  in for   No chief complaint on file.    HPI  [name]  is a 28 y.o. female, with past medical history significant for recently diagnosed DVT last Sunday placed on Xarelto, presented today to PMD Dr. Providence LaniusHowell . Patient had CT of the chest which showed right lung pulmonary embolism and patient was sent here for management. Patient reports 2 weeks history of shortness of breath but no chest pains. Feels okay today with no chest pains, increased shortness of breath or lower extremity pain. Patient has family history of DVT in her cousins . No previous history of DVTs . Patient was followed by Dr.Xu for right knee meniscal tear and probable surgery.    Review of Systems    In addition to the HPI above,  No Fever-chills, No Headache, No changes with Vision or hearing, No problems swallowing food or Liquids, No Abdominal pain, No Nausea or Vommitting, Bowel movements are regular, No Blood in stool or Urine, No dysuria, No new skin rashes or bruises, No new joints pains-aches,  No new weakness, tingling, numbness in any extremity, No recent weight gain or loss, No polyuria, polydypsia or polyphagia, No significant Mental Stressors.  A full 10 point Review of Systems was done, except as stated above, all other Review of Systems were negative.   Social History History  Substance Use Topics  . Smoking status: Never Smoker   . Smokeless tobacco: Not on file  . Alcohol Use: Yes     Family History Cousins have DVT  Prior to Admission medications   Medication Sig Start Date End  Date Taking? Authorizing Provider  acetaminophen (TYLENOL) 500 MG tablet Take 500 mg by mouth every 8 (eight) hours as needed.   Yes Historical Provider, MD  Norethin Ace-Eth Estrad-FE (JUNEL FE 1/20 PO) Take 1 tablet by mouth daily.    Yes Historical Provider, MD  Rivaroxaban (XARELTO) 15 MG TABS tablet Take 15 mg by mouth 2 (two) times daily with a meal.   Yes Historical Provider, MD    Allergies  Allergen Reactions  . Triaminic Anaphylaxis and Hives    Physical Exam  Vitals  Blood pressure 129/71, pulse 90, temperature 98.6 F (37 C), temperature source Oral, resp. rate 17, height 5\' 6"  (1.676 m), weight 80.151 kg (176 lb 11.2 oz), last menstrual period 11/04/2013, SpO2 100.00%.   1. General Young female in no acute distress  2. Normal affect and insight, Not Suicidal or Homicidal, Awake Alert, Oriented X 3.  3. No F.N deficits, grossly, ALL C.Nerves Intact,.  4. Ears and Eyes appear Normal, Conjunctivae clear, PERRLA. Moist Oral Mucosa.  5. Supple Neck,  No JVD, No cervical lymphadenopathy appriciated, No Carotid Bruits.  6. Symmetrical Chest wall movement, Good air movement bilaterally, CTAB.  7. RRR, No Gallops, Rubs or Murmurs, No Parasternal Heave.  8. Positive Bowel Sounds, Abdomen Soft, Non tender, No organomegaly appriciated,No rebound -guarding or rigidity.  9.  No Cyanosis, Normal Skin Turgor, No Skin Rash or Bruise.  10. Good muscle tone,  joints appear normal , no effusions, Normal ROM.  11. No Palpable Lymph Nodes in Neck or Axillae    Data Review  CBC  Recent Labs Lab 12/22/13 2115  WBC 8.3  HGB 13.5  HCT 39.6  PLT 427*  MCV 93.8  MCH 32.0  MCHC 34.1  RDW 12.5   ------------------------------------------------------------------------------------------------------------------  Chemistries   Recent Labs Lab 12/22/13 2115  NA 136*  K 3.5*  CL 99  CO2 24  GLUCOSE 100*  BUN 13  CREATININE 0.87  CALCIUM 9.7  MG 2.0  AST 16  ALT 9   ALKPHOS 38*  BILITOT <0.2*   ------------------------------------------------------------------------------------------------------------------ estimated creatinine clearance is 105.6 ml/min (by C-G formula based on Cr of 0.87). ------------------------------------------------------------------------------------------------------------------ No results found for this basename: TSH, T4TOTAL, FREET3, T3FREE, THYROIDAB,  in the last 72 hours   Coagulation profile No results found for this basename: INR, PROTIME,  in the last 168 hours ------------------------------------------------------------------------------------------------------------------- No results found for this basename: DDIMER,  in the last 72 hours -------------------------------------------------------------------------------------------------------------------  Cardiac Enzymes No results found for this basename: CK, CKMB, TROPONINI, MYOGLOBIN,  in the last 168 hours ------------------------------------------------------------------------------------------------------------------ No components found with this basename: POCBNP,    ---------------------------------------------------------------------------------------------------------------   Imaging results:   Dg Knee Complete 4 Views Right  12/03/2013   CLINICAL DATA:  Right knee pain.  EXAM: RIGHT KNEE - COMPLETE 4+ VIEW  COMPARISON:  None.  FINDINGS: There is no evidence of fracture, dislocation, or joint effusion. There is no evidence of arthropathy or other focal bone abnormality. Soft tissues are unremarkable.  IMPRESSION: Negative.   Electronically Signed   By: Maisie Fushomas  Register   On: 12/03/2013 16:36      Assessment & Plan  1. right leg DVT with right-sided PE-official reports not present yet    Patient on Xarelto    Switch to heparin    Check echo    Check EKG    Check coagulation profile    Check blood studies including CBC and BMP    Probably can send  in a.m. On Xarelto if no right ventricular strain is present    Discussed case with Dr. Ronny BaconMagronat  2. Right knee meniscal tear     To be followed as outpatient to follow with Dr.Xu   DVT Prophylaxis Heparin   AM Labs Ordered, also please review Full Orders  Family Communication: Admission, patients condition and plan of care including tests being ordered have been discussed with the patient and parents who indicate understanding and agree with the plan and Code Status.  Code Status full  Disposition Plan: Home  Time spent in minutes : 34 minutes  Condition GUARDED   @SIGNATURE @

## 2013-12-23 ENCOUNTER — Encounter (HOSPITAL_COMMUNITY): Payer: Self-pay | Admitting: *Deleted

## 2013-12-23 DIAGNOSIS — I82401 Acute embolism and thrombosis of unspecified deep veins of right lower extremity: Secondary | ICD-10-CM

## 2013-12-23 DIAGNOSIS — I2699 Other pulmonary embolism without acute cor pulmonale: Secondary | ICD-10-CM

## 2013-12-23 DIAGNOSIS — I82411 Acute embolism and thrombosis of right femoral vein: Secondary | ICD-10-CM

## 2013-12-23 DIAGNOSIS — S83251D Bucket-handle tear of lateral meniscus, current injury, right knee, subsequent encounter: Secondary | ICD-10-CM

## 2013-12-23 DIAGNOSIS — S83251A Bucket-handle tear of lateral meniscus, current injury, right knee, initial encounter: Secondary | ICD-10-CM | POA: Diagnosis present

## 2013-12-23 DIAGNOSIS — I82419 Acute embolism and thrombosis of unspecified femoral vein: Secondary | ICD-10-CM | POA: Diagnosis present

## 2013-12-23 LAB — CBC
HCT: 38.5 % (ref 36.0–46.0)
Hemoglobin: 12.8 g/dL (ref 12.0–15.0)
MCH: 32.2 pg (ref 26.0–34.0)
MCHC: 33.2 g/dL (ref 30.0–36.0)
MCV: 96.7 fL (ref 78.0–100.0)
PLATELETS: 424 10*3/uL — AB (ref 150–400)
RBC: 3.98 MIL/uL (ref 3.87–5.11)
RDW: 12.6 % (ref 11.5–15.5)
WBC: 7.7 10*3/uL (ref 4.0–10.5)

## 2013-12-23 LAB — BASIC METABOLIC PANEL
Anion gap: 14 (ref 5–15)
BUN: 11 mg/dL (ref 6–23)
CALCIUM: 9.4 mg/dL (ref 8.4–10.5)
CO2: 21 meq/L (ref 19–32)
CREATININE: 0.81 mg/dL (ref 0.50–1.10)
Chloride: 101 mEq/L (ref 96–112)
GFR calc Af Amer: >90 mL/min (ref >90)
GFR calc non Af Amer: >90 mL/min (ref >90)
Glucose, Bld: 103 mg/dL — ABNORMAL HIGH (ref 70–99)
Potassium: 3.6 mEq/L — ABNORMAL LOW (ref 3.7–5.3)
Sodium: 136 mEq/L — ABNORMAL LOW (ref 137–147)

## 2013-12-23 LAB — APTT: APTT: 69 s — AB (ref 24–37)

## 2013-12-23 LAB — HOMOCYSTEINE: HOMOCYSTEINE-NORM: 8.7 umol/L (ref 4.0–15.4)

## 2013-12-23 LAB — HEPARIN LEVEL (UNFRACTIONATED): Heparin Unfractionated: 0.36 IU/mL (ref 0.30–0.70)

## 2013-12-23 LAB — ANTITHROMBIN III: AntiThromb III Func: 96 % (ref 75–120)

## 2013-12-23 MED ORDER — POTASSIUM CHLORIDE CRYS ER 20 MEQ PO TBCR
40.0000 meq | EXTENDED_RELEASE_TABLET | Freq: Once | ORAL | Status: AC
  Administered 2013-12-23: 40 meq via ORAL
  Filled 2013-12-23: qty 2

## 2013-12-23 MED ORDER — RIVAROXABAN 15 MG PO TABS
15.0000 mg | ORAL_TABLET | Freq: Two times a day (BID) | ORAL | Status: DC
  Administered 2013-12-23 – 2013-12-24 (×2): 15 mg via ORAL
  Filled 2013-12-23 (×4): qty 1

## 2013-12-23 MED ORDER — INFLUENZA VAC SPLIT QUAD 0.5 ML IM SUSY
0.5000 mL | PREFILLED_SYRINGE | INTRAMUSCULAR | Status: DC
  Filled 2013-12-23 (×2): qty 0.5

## 2013-12-23 NOTE — Progress Notes (Signed)
  Echocardiogram 2D Echocardiogram has been performed.  [name], [name] 12/23/2013, 1:18 PM

## 2013-12-23 NOTE — Progress Notes (Signed)
[facility] PROGRESS NOTE   [name] [id number] DOB: 02/12/1986 DOA: 12/22/2013 PCP: No PCP Per Patient  HPI/Subjective: Right lower extremity pain and swelling.  Assessment/Plan: Principal Problem:   Pulmonary embolism Active Problems:   DVT, femoral, acute   Bucket handle tear of lateral meniscus of right knee    Acute PE -Patient started on IV heparin upon admission to the hospital. -Likely will be discharged on oral Xarleto. -Patient is on birth control pills, and recent injury/inflammation to the right knee. -Hypercoagulability panel obtained prior to initiation of IV heparin. -Patient denies any significant chest pain, no hypoxia or presyncopal symptoms. -2-D echocardiogram was done and showed no evidence of right heart strain.  Acute DVT -Involving the right lower extremity, the femoral and popliteal veins. -As mentioned above patient was started recently on Xarelto, currently on heparin drip. -Seen by vascular surgery, IR also to evaluate for DVT thrombolysis. -I have asked patient to keep right leg elevated, per Dr. Myra GianottiBrabham compression stocking to decrease the possibility of post phlebitic syndrome.  Right knee meniscal tear -Patient has bucket handle tear of the lateral meniscus of right knee, patient is followed by Dr. Roda ShuttersXu.   Code Status: Full code Family Communication: Plan discussed with the patient. Disposition Plan: Remains inpatient   Consultants:  Dr. Myra GianottiBrabham  Procedures:  None  Antibiotics:  None   Objective: Filed Vitals:   12/23/13 1452  BP: 123/62  Pulse: 74  Temp: 98.3 F (36.8 C)  Resp: 20    Intake/Output Summary (Last 24 hours) at 12/23/13 1658 Last data filed at 12/23/13 1300  Gross per 24 hour  Intake    240 ml  Output      0 ml  Net    240 ml   Filed Weights   12/22/13 1805  Weight: 80.151 kg (176 lb 11.2 oz)    Exam: General: Alert and awake, oriented x3, not in any acute distress. HEENT:  anicteric sclera, pupils reactive to light and accommodation, EOMI CVS: S1-S2 clear, no murmur rubs or gallops Chest: clear to auscultation bilaterally, no wheezing, rales or rhonchi Abdomen: soft nontender, nondistended, normal bowel sounds, no organomegaly Extremities: no cyanosis, clubbing or edema noted bilaterally Neuro: Cranial nerves II-XII intact, no focal neurological deficits  Data Reviewed: Basic Metabolic Panel:  Recent Labs Lab 12/22/13 2115 12/23/13 0516  NA 136* 136*  K 3.5* 3.6*  CL 99 101  CO2 24 21  GLUCOSE 100* 103*  BUN 13 11  CREATININE 0.87 0.81  CALCIUM 9.7 9.4  MG 2.0  --   PHOS 3.2  --    Liver Function Tests:  Recent Labs Lab 12/22/13 2115  AST 16  ALT 9  ALKPHOS 38*  BILITOT <0.2*  PROT 8.2  ALBUMIN 3.0*   No results found for this basename: LIPASE, AMYLASE,  in the last 168 hours No results found for this basename: AMMONIA,  in the last 168 hours CBC:  Recent Labs Lab 12/22/13 2115 12/23/13 0516  WBC 8.3 7.7  HGB 13.5 12.8  HCT 39.6 38.5  MCV 93.8 96.7  PLT 427* 424*   Cardiac Enzymes: No results found for this basename: CKTOTAL, CKMB, CKMBINDEX, TROPONINI,  in the last 168 hours BNP (last 3 results) No results found for this basename: PROBNP,  in the last 8760 hours CBG: No results found for this basename: GLUCAP,  in the last 168 hours  Micro No results found for this or any previous visit (from the past 240 hour(s)).  Studies: No results found.  Scheduled Meds: . [START ON 12/24/2013] Influenza vac split quadrivalent PF  0.5 mL Intramuscular Tomorrow-1000  . Rivaroxaban  15 mg Oral BID WC  . sodium chloride  3 mL Intravenous Q12H  . sodium chloride  3 mL Intravenous Q12H   Continuous Infusions: . heparin 1,100 Units/hr (12/22/13 2250)       Time spent: 35 minutes    [facility] HospitalELMAHI,Ival Pacer A  [facility] Pager [phone] If 7PM-7AM, please contact night-coverage at [email or web address], password [email or web address] 12/23/2013,  4:58 PM  LOS: 1 day

## 2013-12-23 NOTE — Progress Notes (Signed)
ANTICOAGULATION CONSULT NOTE - Follow Up Consult  Pharmacy Consult for Heparin  Indication: pulmonary embolus, DVT  Allergies  Allergen Reactions  . Triaminic Anaphylaxis and Hives    Vital Signs: Temp: 98 F (36.7 C) (10/16 0444) Temp Source: Oral (10/16 0444) BP: 114/67 mmHg (10/16 0444) Pulse Rate: 79 (10/16 0444)  Labs:  Recent Labs  12/22/13 2115 12/23/13 0516  HGB 13.5 12.8  HCT 39.6 38.5  PLT 427* 424*  APTT 33 69*  HEPARINUNFRC  --  0.36  CREATININE 0.87 0.81    Estimated Creatinine Clearance: 113.5 ml/min (by C-G formula based on Cr of 0.81).  Assessment: Heparin for DVT/PE, was on Xarelto PTA, HL is 0.36 and aPTT is 69 so can use HL to dose given the close correlation between the HL/aPTT. Other labs as above.   Goal of Therapy:  Heparin level 0.3-0.7 units/ml Monitor platelets by anticoagulation protocol: Yes   Plan:  -Continue heparin at 1100 units/hr -1200 HL to confirm -Daily CBC/HL -Monitor for bleeding  Abran DukeLedford, Darshana Curnutt 12/23/2013,6:23 AM

## 2013-12-23 NOTE — Discharge Instructions (Addendum)
Information on my medicine - XARELTO (rivaroxaban)  This medication education was reviewed with me or my healthcare representative as part of my discharge preparation.   WHY WAS XARELTO PRESCRIBED FOR YOU? Xarelto was prescribed to treat blood clots that may have been found in the veins of your legs (deep vein thrombosis) or in your lungs (pulmonary embolism) and to reduce the risk of them occurring again.  What do you need to know about Xarelto? The starting dose is one 15 mg tablet taken TWICE daily with food for the FIRST 21 DAYS then on 11/2  the dose is changed to one 20 mg tablet taken ONCE A DAY with your evening meal.  DO NOT stop taking Xarelto without talking to the health care provider who prescribed the medication.  Refill your prescription for 20 mg tablets before you run out.  After discharge, you should have regular check-up appointments with your healthcare provider that is prescribing your Xarelto.  In the future your dose may need to be changed if your kidney function changes by a significant amount.  What do you do if you miss a dose? If you are taking Xarelto TWICE DAILY and you miss a dose, take it as soon as you remember. You may take two 15 mg tablets (total 30 mg) at the same time then resume your regularly scheduled 15 mg twice daily the next day.  If you are taking Xarelto ONCE DAILY and you miss a dose, take it as soon as you remember on the same day then continue your regularly scheduled once daily regimen the next day. Do not take two doses of Xarelto at the same time.   Important Safety Information Xarelto is a blood thinner medicine that can cause bleeding. You should call your healthcare provider right away if you experience any of the following:   Bleeding from an injury or your nose that does not stop.   Unusual colored urine (red or dark brown) or unusual colored stools (red or black).   Unusual bruising for unknown reasons.   A serious fall or if  you hit your head (even if there is no bleeding).  Some medicines may interact with Xarelto and might increase your risk of bleeding while on Xarelto. To help avoid this, consult your healthcare provider or pharmacist prior to using any new prescription or non-prescription medications, including herbals, vitamins, non-steroidal anti-inflammatory drugs (NSAIDs) and supplements.  This website has more information on Xarelto: [email or web address].[email or web address].

## 2013-12-23 NOTE — Progress Notes (Addendum)
ANTICOAGULATION CONSULT NOTE - Follow Up Consult  Pharmacy Consult for Xarelto  Indication: pulmonary embolus, DVT  Allergies  Allergen Reactions  . Triaminic Anaphylaxis and Hives    Vital Signs: Temp: 98.2 F (36.8 C) (10/16 1000) Temp Source: Oral (10/16 1000) BP: 116/61 mmHg (10/16 1000) Pulse Rate: 76 (10/16 1000)  Labs:  Recent Labs  12/22/13 2115 12/23/13 0516  HGB 13.5 12.8  HCT 39.6 38.5  PLT 427* 424*  APTT 33 69*  HEPARINUNFRC  --  0.36  CREATININE 0.87 0.81    Estimated Creatinine Clearance: 113.5 ml/min (by C-G formula based on Cr of 0.81).  Assessment: [age] YOF with newly diagnosed DVT/PE, was on Xarelto PTA (started 10/11), then switched to IV heparin after admission. APTT therapeutic this morning, now plan to switch back to xarelto. Hypercoagulable state panel pending. Her CBC is stable and wnl. No bleeding noted per chart.   Goal of Therapy:  Monitor platelets by anticoagulation protocol: Yes   Plan:  - Continue heparin at 1100 units/hr until 1700 - Xarelto 15mg  BID first dose at 1700 or before discharge if going home this afternoon. - Continue xarelto 15mg  BID through 11/1, then start xarelto 20mg  daily from 11/2 - Monitor for bleeding  Bayard HuggerMei Aleta Manternach, PharmD, BCPS  Clinical Pharmacist  Pager: [phone]   12/23/2013,12:19 PM

## 2013-12-23 NOTE — Consult Note (Addendum)
Consult Note  Patient name: Verlee MonteJasmine B Jamar MRN: [id number] DOB: 11/07/1985 Sex: female  Consulting Physician:  Hospitalist  Reason for Consult: No chief complaint on file.   HISTORY OF PRESENT ILLNESS: This is a 28 year old female who recently was diagnosed with a DVT in her right leg this past Sunday.  She recently fell and for her meniscus.  She was seen by Dr. Roda ShuttersXu.  A MRI detected a DVT.  This was followed up with duplex ultrasound showing a popliteal and femoral DVT.  The patient did have pleuritic chest pain.  She was started on anticoagulation with Lesia HausenXaralto this past Sunday.  She had a CT scan which was positive for pulmonary embolism.  The patient does report that she was on birth control pills.  She does not smoke.  She has no other significant past medical history.  History reviewed. No pertinent past medical history.  History reviewed. No pertinent past surgical history.  History   Social History  . Marital Status: Single    Spouse Name: N/A    Number of Children: N/A  . Years of Education: N/A   Occupational History  . Not on file.   Social History Main Topics  . Smoking status: Never Smoker   . Smokeless tobacco: Not on file  . Alcohol Use: Yes  . Drug Use: No  . Sexual Activity: Not on file   Other Topics Concern  . Not on file   Social History Narrative  . No narrative on file    History reviewed. No pertinent family history.  Allergies as of 12/22/2013 - Review Complete 12/22/2013  Allergen Reaction Noted  . Triaminic Anaphylaxis and Hives 05/13/2011    No current facility-administered medications on file prior to encounter.   Current Outpatient Prescriptions on File Prior to Encounter  Medication Sig Dispense Refill  . Norethin Ace-Eth Estrad-FE (JUNEL FE 1/20 PO) Take 1 tablet by mouth daily.          REVIEW OF SYSTEMS: Cardiovascular: Positive for recent chest pain Pulmonary: Positive for recent shortness of breath Neurologic: No  weakness, paresthesias, aphasia, or amaurosis. No dizziness. Hematologic: No bleeding problems or clotting disorders. Musculoskeletal: No joint pain or joint swelling. Gastrointestinal: Right knee pain Genitourinary: No dysuria or hematuria. Psychiatric:: No history of major depression. Integumentary: No rashes or ulcers. Constitutional: No fever or chills.  PHYSICAL EXAMINATION: General: The patient appears their stated age.  Vital signs are BP 123/62  Pulse 74  Temp(Src) 98.3 F (36.8 C) (Oral)  Resp 20  Ht 5\' 6"  (1.676 m)  Wt 176 lb 11.2 oz (80.151 kg)  BMI 28.53 kg/m2  SpO2 100%  LMP 11/04/2013 Pulmonary: Respirations are non-labored HEENT:  No gross abnormalities Musculoskeletal: There are no major deformities.   Neurologic: No focal weakness or paresthesias are detected, Skin: There are no ulcer or rashes noted. Psychiatric: The patient has normal affect. Cardiovascular: There is a regular rate and rhythm without significant murmur appreciated.  Right leg is tender to palpation particularly in the calf region.  It is slightly larger than the left  Diagnostic Studies: Ultrasound was positive for a right leg DVT MRI was positive for meniscus injury. CT of the chest was positive for pulmonary embolism   Assessment:  Right leg DVT and pulmonary embolism Plan: I had extensive discussion with the family lasting approximately 45 minutes.  In my opinion I feel that she should continue with oral anti-coagulation for approximately 6 months.  I also discussed the importance of 20-30 compression stockings.  She has already stopped her birth control pill.  I do not feel that she has any activity restrictions related to her PE and DVT.  We did discuss the possibility of thrombolysis, to minimize her risk for postphlebitic syndrome.  I have personally spoken with interventional radiology and they'll evaluate her later on this evening.  Please contact me if any further  questions.     Jorge NyV. Wells Brabham IV, M.D. [facility] Office: [phone] Pager:  [phone]

## 2013-12-23 NOTE — Consult Note (Signed)
Chief Complaint: Right lower extremity pain and swelling.   Referring Physician(s): Dr. Nada LibmanVance W Brabham, Vascular Surgery  History of Present Illness: [name] is a very pleasant 28 y.o. female admitted 12/22/2013 for lower extremity pain and swelling, as well as for treatment of acute PE.  She had a knee injury on the preceding Saturday, which is her presumed risk factor for development of RLE DVT.  She was also on OCP's, which she has now stopped.    Currently, she is experiencing RLE pain, swelling, and warmth to her leg, as well as focal pain in the right knee from a documented meniscal injury.  The skin is warm to touch.  No blanching, no ulceration.  No pitting edema.   She has no known history of brain tumor, stroke, or collagen vascular disease.  She has had no major surgeries.  She is a candidate for pharmacologic lysis.  History reviewed. No pertinent past medical history.  History reviewed. No pertinent past surgical history.  Allergies: Triaminic  Medications: Prior to Admission medications   Medication Sig Start Date End Date Taking? Authorizing Provider  acetaminophen (TYLENOL) 500 MG tablet Take 500 mg by mouth every 8 (eight) hours as needed.   Yes Historical Provider, MD  Norethin Ace-Eth Estrad-FE (JUNEL FE 1/20 PO) Take 1 tablet by mouth daily.    Yes Historical Provider, MD  Rivaroxaban (XARELTO) 15 MG TABS tablet Take 15 mg by mouth 2 (two) times daily with a meal.   Yes Historical Provider, MD    History reviewed. No pertinent family history.  History   Social History  . Marital Status: Single    Spouse Name: N/A    Number of Children: N/A  . Years of Education: N/A   Social History Main Topics  . Smoking status: Never Smoker   . Smokeless tobacco: None  . Alcohol Use: Yes  . Drug Use: No  . Sexual Activity: None   Other Topics Concern  . None   Social History Narrative  . None    ECOG Status: 1 - Symptomatic but completely  ambulatory  Review of Systems: A 12 point ROS discussed and pertinent positives are indicated in the HPI above.  All other systems are negative.  Review of Systems  Vital Signs: BP 123/62  Pulse 74  Temp(Src) 98.3 F (36.8 C) (Oral)  Resp 20  Ht 5\' 6"  (1.676 m)  Wt 176 lb 11.2 oz (80.151 kg)  BMI 28.53 kg/m2  SpO2 100%  LMP 11/04/2013  Physical Exam  Imaging: Dg Knee Complete 4 Views Right  12/03/2013   CLINICAL DATA:  Right knee pain.  EXAM: RIGHT KNEE - COMPLETE 4+ VIEW  COMPARISON:  None.  FINDINGS: There is no evidence of fracture, dislocation, or joint effusion. There is no evidence of arthropathy or other focal bone abnormality. Soft tissues are unremarkable.  IMPRESSION: Negative.   Electronically Signed   By: Maisie Fushomas  Register   On: 12/03/2013 16:36    Labs:  CBC:  Recent Labs  12/22/13 2115 12/23/13 0516  WBC 8.3 7.7  HGB 13.5 12.8  HCT 39.6 38.5  PLT 427* 424*    COAGS:  Recent Labs  12/22/13 2115 12/23/13 0516  APTT 33 69*    BMP:  Recent Labs  12/22/13 2115 12/23/13 0516  NA 136* 136*  K 3.5* 3.6*  CL 99 101  CO2 24 21  GLUCOSE 100* 103*  BUN 13 11  CALCIUM 9.7 9.4  CREATININE 0.87 0.81  GFRNONAA >90 >90  GFRAA >90 >90    LIVER FUNCTION TESTS:  Recent Labs  12/22/13 2115  BILITOT <0.2*  AST 16  ALT 9  ALKPHOS 38*  PROT 8.2  ALBUMIN 3.0*    TUMOR MARKERS: No results found for this basename: AFPTM, CEA, CA199, CHROMGRNA,  in the last 8760 hours  Assessment and Plan:  28 yo female with symptomatic acute RLE DVT (CFV to Popliteal v.) secondary to trauma and synergistic OCP use.   At risk for development of post-thrombotic syndrome, as she is 28 years old.    A full informed consent of the risks and benefits of venous lysis was offered the patient and her family, regarding pharmacologic and mechanical techniques. Specifically, the risks discussed included: bleeding (GI bleeding, neurologic bleeding, etc), infection,  embolism, contrast reaction, ongoing symptoms, continued thrombus/occlusion, renal injury, vessel injury, cardiopulmonary collapse.  The patient understands these risks and wishes to proceed with the lysis.    My impression is that she is at low risk for any bleeding given her young age and health, and that she stands to benefit greatly from a successful lysis. She is scheduled to begin therapy on Saturday, October 17th, anticipating an overnight ICU stay.      Thank you for this interesting consult.  I greatly enjoyed meeting [name] and her family and look forward to participating in their care.    I spent a total of 55 minutes face to face in clinical consultation, greater than 50% of which was counseling/coordinating care for right lower extremity symptomatic DVT.   SignedMargaretha Sheffield: Sanae Willetts S 12/23/2013, 6:52 PM

## 2013-12-23 NOTE — Progress Notes (Signed)
Utilization review completed. Tiandra Swoveland, RN, BSN. 

## 2013-12-24 ENCOUNTER — Inpatient Hospital Stay (HOSPITAL_COMMUNITY)

## 2013-12-24 DIAGNOSIS — I82419 Acute embolism and thrombosis of unspecified femoral vein: Secondary | ICD-10-CM

## 2013-12-24 LAB — CBC
HEMATOCRIT: 38 % (ref 36.0–46.0)
HEMOGLOBIN: 12.7 g/dL (ref 12.0–15.0)
MCH: 31.9 pg (ref 26.0–34.0)
MCHC: 33.4 g/dL (ref 30.0–36.0)
MCV: 95.5 fL (ref 78.0–100.0)
Platelets: 396 10*3/uL (ref 150–400)
RBC: 3.98 MIL/uL (ref 3.87–5.11)
RDW: 12.6 % (ref 11.5–15.5)
WBC: 5.7 10*3/uL (ref 4.0–10.5)

## 2013-12-24 LAB — PROTIME-INR
INR: 1.39 (ref 0.00–1.49)
PROTHROMBIN TIME: 17.2 s — AB (ref 11.6–15.2)

## 2013-12-24 MED ORDER — LIDOCAINE HCL 1 % IJ SOLN
INTRAMUSCULAR | Status: AC
  Filled 2013-12-24: qty 20

## 2013-12-24 MED ORDER — MIDAZOLAM HCL 2 MG/2ML IJ SOLN
INTRAMUSCULAR | Status: AC
  Filled 2013-12-24: qty 2

## 2013-12-24 MED ORDER — FENTANYL CITRATE 0.05 MG/ML IJ SOLN
INTRAMUSCULAR | Status: AC | PRN
  Administered 2013-12-24: 50 ug via INTRAVENOUS

## 2013-12-24 MED ORDER — DOCUSATE SODIUM 100 MG PO CAPS
100.0000 mg | ORAL_CAPSULE | Freq: Every day | ORAL | Status: DC
  Administered 2013-12-24 – 2013-12-25 (×2): 100 mg via ORAL
  Filled 2013-12-24 (×4): qty 1

## 2013-12-24 MED ORDER — HEPARIN (PORCINE) IN NACL 100-0.45 UNIT/ML-% IJ SOLN
1050.0000 [IU]/h | INTRAMUSCULAR | Status: DC
  Administered 2013-12-25: 1100 [IU]/h via INTRAVENOUS
  Administered 2013-12-26: 1050 [IU]/h via INTRAVENOUS
  Administered 2013-12-26: 1150 [IU]/h via INTRAVENOUS
  Administered 2013-12-27: 1050 [IU]/h via INTRAVENOUS
  Filled 2013-12-24 (×4): qty 250

## 2013-12-24 MED ORDER — MIDAZOLAM HCL 2 MG/2ML IJ SOLN
INTRAMUSCULAR | Status: AC | PRN
  Administered 2013-12-24 (×2): 1 mg via INTRAVENOUS

## 2013-12-24 MED ORDER — IOHEXOL 300 MG/ML  SOLN
100.0000 mL | Freq: Once | INTRAMUSCULAR | Status: AC | PRN
  Administered 2013-12-24: 1 mL via INTRAVENOUS

## 2013-12-24 MED ORDER — FENTANYL CITRATE 0.05 MG/ML IJ SOLN
INTRAMUSCULAR | Status: AC
  Filled 2013-12-24: qty 2

## 2013-12-24 MED ORDER — ACETAMINOPHEN 325 MG PO TABS
650.0000 mg | ORAL_TABLET | Freq: Four times a day (QID) | ORAL | Status: DC | PRN
  Administered 2013-12-24 – 2013-12-27 (×3): 650 mg via ORAL
  Filled 2013-12-24 (×3): qty 2

## 2013-12-24 NOTE — Progress Notes (Signed)
Patient ID: [name], female   DOB: 10/22/1985, 28 y.o.   MRN: [id number] I examined the patient with ultrasound and found the right CFV to be patent.  There is thrombus in right femoral vein and right popliteal vein.  I discussed treatment options with patient in depth.  Patient understands the risks and benefits of thrombolysis and wanted to proceed with thrombolysis.  The patient was placed prone on table.  The procedure was started and we had accessed the right popliteal vein with ultrasound.  At this point, I contacted pharmacy about thrombolytic medications and Xarelto.  Pharmacy did not recommend thrombolysis while on Xarelto.  As a result, the procedure was stopped and the access wire was removed.    Patient would like to proceed with thrombolytic therapy.  We will plan for catheter placement when pharmacy feels it is safe to give heparin and thrombolysis.

## 2013-12-24 NOTE — Sedation Documentation (Signed)
Procedure ended due to patient being on Zaralto.

## 2013-12-24 NOTE — Progress Notes (Signed)
[facility] PROGRESS NOTE   [name] ZOX:[id number] DOB: 05/17/1985 DOA: 12/22/2013 PCP: No PCP Per Patient  HPI/Subjective: Seen with family at bedside (probably more than 8 family members at bedside) Questions answered.  Assessment/Plan: Principal Problem:   Pulmonary embolism Active Problems:   DVT, femoral, acute   Bucket handle tear of lateral meniscus of right knee    Acute PE -Patient started on IV heparin upon admission to the hospital. -Currently on oral Xarleto. -Patient was on OCPs, and recent injury/inflammation to the right knee. -Hypercoagulability panel obtained prior to initiation of IV heparin. -Patient denies any significant chest pain, no hypoxia or presyncopal symptoms. -2-D echocardiogram was done and showed no evidence of right heart strain.  Acute DVT -Involving the right lower extremity, the femoral and popliteal veins. -Seen by vascular surgery, VIR for thrombolysis. -Patient likely to spend the night in the ICU because of thrombolytics.  Right knee meniscal tear -Patient has bucket handle tear of the lateral meniscus of right knee, patient is followed by Dr. Roda ShuttersXu.   Code Status: Full code Family Communication: Plan discussed with the patient. Disposition Plan: Remains inpatient   Consultants:  Dr. Myra GianottiBrabham  Procedures:  None  Antibiotics:  None   Objective: Filed Vitals:   12/24/13 0642  BP: 106/60  Pulse: 78  Temp: 98.6 F (37 C)  Resp: 16    Intake/Output Summary (Last 24 hours) at 12/24/13 1201 Last data filed at 12/23/13 1300  Gross per 24 hour  Intake    240 ml  Output      0 ml  Net    240 ml   Filed Weights   12/22/13 1805  Weight: 80.151 kg (176 lb 11.2 oz)    Exam: General: Alert and awake, oriented x3, not in any acute distress. HEENT: anicteric sclera, pupils reactive to light and accommodation, EOMI CVS: S1-S2 clear, no murmur rubs or gallops Chest: clear to auscultation bilaterally, no  wheezing, rales or rhonchi Abdomen: soft nontender, nondistended, normal bowel sounds, no organomegaly Extremities: no cyanosis, clubbing or edema noted bilaterally Neuro: Cranial nerves II-XII intact, no focal neurological deficits  Data Reviewed: Basic Metabolic Panel:  Recent Labs Lab 12/22/13 2115 12/23/13 0516  NA 136* 136*  K 3.5* 3.6*  CL 99 101  CO2 24 21  GLUCOSE 100* 103*  BUN 13 11  CREATININE 0.87 0.81  CALCIUM 9.7 9.4  MG 2.0  --   PHOS 3.2  --    Liver Function Tests:  Recent Labs Lab 12/22/13 2115  AST 16  ALT 9  ALKPHOS 38*  BILITOT <0.2*  PROT 8.2  ALBUMIN 3.0*   No results found for this basename: LIPASE, AMYLASE,  in the last 168 hours No results found for this basename: AMMONIA,  in the last 168 hours CBC:  Recent Labs Lab 12/22/13 2115 12/23/13 0516 12/24/13 0503  WBC 8.3 7.7 5.7  HGB 13.5 12.8 12.7  HCT 39.6 38.5 38.0  MCV 93.8 96.7 95.5  PLT 427* 424* 396   Cardiac Enzymes: No results found for this basename: CKTOTAL, CKMB, CKMBINDEX, TROPONINI,  in the last 168 hours BNP (last 3 results) No results found for this basename: PROBNP,  in the last 8760 hours CBG: No results found for this basename: GLUCAP,  in the last 168 hours  Micro No results found for this or any previous visit (from the past 240 hour(s)).   Studies: No results found.  Scheduled Meds: . fentaNYL      .  Influenza vac split quadrivalent PF  0.5 mL Intramuscular Tomorrow-1000  . lidocaine      . midazolam      . Rivaroxaban  15 mg Oral BID WC  . sodium chloride  3 mL Intravenous Q12H  . sodium chloride  3 mL Intravenous Q12H   Continuous Infusions:       Time spent: 35 minutes    [facility] Associates LLCELMAHI,Mila Pair A  [facility] Pager [phone] If 7PM-7AM, please contact night-coverage at [email or web address], password [email or web address] 12/24/2013, 12:01 PM  LOS: 2 days

## 2013-12-24 NOTE — Progress Notes (Signed)
ANTICOAGULATION CONSULT NOTE - Follow Up Consult  Pharmacy Consult for Heparin  Indication: pulmonary embolus, DVT  Allergies  Allergen Reactions  . Triaminic Anaphylaxis and Hives    Vital Signs: Temp: 98.5 F (36.9 C) (10/17 1310) Temp Source: Oral (10/17 1310) BP: 116/60 mmHg (10/17 1310) Pulse Rate: 67 (10/17 1310)  Labs:  Recent Labs  12/22/13 2115 12/23/13 0516 12/24/13 0503 12/24/13 0833  HGB 13.5 12.8 12.7  --   HCT 39.6 38.5 38.0  --   PLT 427* 424* 396  --   APTT 33 69*  --   --   LABPROT  --   --   --  17.2*  INR  --   --   --  1.39  HEPARINUNFRC  --  0.36  --   --   CREATININE 0.87 0.81  --   --     Estimated Creatinine Clearance: 113.5 ml/min (by C-G formula based on Cr of 0.81).  Assessment: Heparin for DVT/PE, was on changed back to Xarelto yesterday.  Planning thrombolysis, so Xarelto to be changed back to heparin starting tomorrow.  Goal of Therapy:  Heparin level 0.3-0.7 units/ml Monitor platelets by anticoagulation protocol: Yes   Plan:  -Start heparin at 1100 units/hr tomorrow morning -1200 PTT and daily - heparin level will be elevated to to recent Xarelto use. -Daily CBC/PTT daily -Monitor for bleeding and plans for thrombolysis  Mickeal SkinnerFrens, [place] Mallon John 12/24/2013,2:16 PM

## 2013-12-24 NOTE — Progress Notes (Signed)
Asked Beckey DowningPam Turpin, PA when she was rounding about xarelto and asked if it was okay to give despite patient going to procedure. Pam said that it didn't matter either way.

## 2013-12-24 NOTE — Consult Note (Signed)
Chief Complaint: R LE DVT/PE w/o heart strain Now on Xarelto  Referring Physician(s): [facility]  History of Present Illness: [name] is a 28 y.o. female  Pt suffered knee injury more than a week ago Developed Rt leg swelling and pain and was discovered to have RLE DVT Admitted to hospital and CT revealed +PE  No heart strain per Echo Now on Xarelto Off OCPs Continues to have pain in RT leg Dr Arthor CaptainElmahi and Dr Myra GianottiBrabham have asked IR to consult for RLE venous thrombbolysis Dr Loreta AveWagner reviewed chart and imaging and has examined pt See note in chart Feels pt is appropriate candidate for procedure Now scheduled for today     History reviewed. No pertinent past medical history.  History reviewed. No pertinent past surgical history.  Allergies: Triaminic  Medications: Prior to Admission medications   Medication Sig Start Date End Date Taking? Authorizing Provider  acetaminophen (TYLENOL) 500 MG tablet Take 500 mg by mouth every 8 (eight) hours as needed.   Yes Historical Provider, MD  Norethin Ace-Eth Estrad-FE (JUNEL FE 1/20 PO) Take 1 tablet by mouth daily.    Yes Historical Provider, MD  Rivaroxaban (XARELTO) 15 MG TABS tablet Take 15 mg by mouth 2 (two) times daily with a meal.   Yes Historical Provider, MD    History reviewed. No pertinent family history.  History   Social History  . Marital Status: Single    Spouse Name: N/A    Number of Children: N/A  . Years of Education: N/A   Social History Main Topics  . Smoking status: Never Smoker   . Smokeless tobacco: None  . Alcohol Use: Yes  . Drug Use: No  . Sexual Activity: None   Other Topics Concern  . None   Social History Narrative  . None     Review of Systems: A 12 point ROS discussed and pertinent positives are indicated in the HPI above.  All other systems are negative.  Review of Systems  Constitutional: Positive for activity change. Negative for fever, appetite change and unexpected weight  change.  Respiratory: Negative for cough, chest tightness, shortness of breath and wheezing.   Cardiovascular: Positive for leg swelling. Negative for chest pain.  Gastrointestinal: Negative for abdominal pain.  Genitourinary: Negative for difficulty urinating.  Musculoskeletal: Positive for gait problem. Negative for back pain.  Skin: Negative for color change.  Psychiatric/Behavioral: Negative for behavioral problems and confusion.    Vital Signs: BP 106/60  Pulse 78  Temp(Src) 98.6 F (37 C) (Oral)  Resp 16  Ht 5\' 6"  (1.676 m)  Wt 80.151 kg (176 lb 11.2 oz)  BMI 28.53 kg/m2  SpO2 100%  LMP 11/04/2013  Physical Exam  Constitutional: She appears well-nourished.  Cardiovascular: Normal rate, regular rhythm and normal heart sounds.   Pulmonary/Chest: Effort normal and breath sounds normal. She has no wheezes.  Abdominal: Soft. Bowel sounds are normal. There is no tenderness.  Musculoskeletal: Normal range of motion.  Minimal Rt leg swelling  Skin: Skin is warm and dry.  Psychiatric: She has a normal mood and affect. Her behavior is normal. Judgment and thought content normal.    Imaging: Dg Knee Complete 4 Views Right  12/03/2013   CLINICAL DATA:  Right knee pain.  EXAM: RIGHT KNEE - COMPLETE 4+ VIEW  COMPARISON:  None.  FINDINGS: There is no evidence of fracture, dislocation, or joint effusion. There is no evidence of arthropathy or other focal bone abnormality. Soft tissues are unremarkable.  IMPRESSION: Negative.   Electronically Signed   By: Maisie Fushomas  Register   On: 12/03/2013 16:36    Labs:  CBC:  Recent Labs  12/22/13 2115 12/23/13 0516 12/24/13 0503  WBC 8.3 7.7 5.7  HGB 13.5 12.8 12.7  HCT 39.6 38.5 38.0  PLT 427* 424* 396    COAGS:  Recent Labs  12/22/13 2115 12/23/13 0516  APTT 33 69*    BMP:  Recent Labs  12/22/13 2115 12/23/13 0516  NA 136* 136*  K 3.5* 3.6*  CL 99 101  CO2 24 21  GLUCOSE 100* 103*  BUN 13 11  CALCIUM 9.7 9.4    CREATININE 0.87 0.81  GFRNONAA >90 >90  GFRAA >90 >90    LIVER FUNCTION TESTS:  Recent Labs  12/22/13 2115  BILITOT <0.2*  AST 16  ALT 9  ALKPHOS 38*  PROT 8.2  ALBUMIN 3.0*    TUMOR MARKERS: No results found for this basename: AFPTM, CEA, CA199, CHROMGRNA,  in the last 8760 hours  Assessment and Plan:  RLE DVT PE w/o heart strain Continued Rt leg swelling and pain On Xarelto and off OCPs Now scheduled for RLE venous thrombolysis and possible angioplasty/stent placement Pt and family aware of procedure benefits and risks and agreeable to proceed Consent signed andin chart  Thank you for this interesting consult.  I greatly enjoyed meeting [name] and look forward to participating in their care.    I spent a total of 40 minutes face to face in clinical consultation, greater than 50% of which was counseling/coordinating care for RLE DVT thrombolysis with poss pta/stent  Signed: Jehad Bisono A 12/24/2013, 8:35 AM

## 2013-12-25 LAB — CBC
HEMATOCRIT: 37.2 % (ref 36.0–46.0)
HEMOGLOBIN: 12.6 g/dL (ref 12.0–15.0)
MCH: 31.9 pg (ref 26.0–34.0)
MCHC: 33.9 g/dL (ref 30.0–36.0)
MCV: 94.2 fL (ref 78.0–100.0)
Platelets: 408 10*3/uL — ABNORMAL HIGH (ref 150–400)
RBC: 3.95 MIL/uL (ref 3.87–5.11)
RDW: 12.5 % (ref 11.5–15.5)
WBC: 5.4 10*3/uL (ref 4.0–10.5)

## 2013-12-25 LAB — APTT
APTT: 113 s — AB (ref 24–37)
aPTT: 65 seconds — ABNORMAL HIGH (ref 24–37)

## 2013-12-25 NOTE — Progress Notes (Signed)
[facility] PROGRESS NOTE   Verlee MonteJasmine B Dullea XBJ:[id number] DOB: 12/04/1985 DOA: 12/22/2013 PCP: No PCP Per Patient  HPI/Subjective: Seen with mother at bedside, procedure scheduled for Monday morning.  Assessment/Plan: Principal Problem:   Pulmonary embolism Active Problems:   DVT, femoral, acute   Bucket handle tear of lateral meniscus of right knee    Acute PE -Patient started on IV heparin upon admission to the hospital. -Back on heparin, Xarelto hold, last dose 10/17 at 8:32 AM -Patient was on OCPs, and recent injury/inflammation to the right knee. -Hypercoagulability panel obtained prior to initiation of IV heparin. -Patient denies any significant chest pain, no hypoxia or presyncopal symptoms. -2-D echocardiogram was done and showed no evidence of right heart strain.  Acute DVT -Involving the right lower extremity, the femoral and popliteal veins. -Seen by vascular surgery, VIR for thrombolysis. -Patient likely to spend the night in the ICU because of thrombolytics. -VIR want to wait till Monday morning "until" Xarelto is completely out of her system. -Xarelto half-life is 5-9 hours in young individuals, for all types of surgeries it is recommended to stop Xarelto 24 hours prior to surgery.   Right knee meniscal tear -Patient has bucket handle tear of the lateral meniscus of right knee, patient is followed by Dr. Roda ShuttersXu.   Code Status: Full code Family Communication: Plan discussed with the patient. Disposition Plan: Remains inpatient   Consultants:  Dr. Myra GianottiBrabham  Procedures:  None  Antibiotics:  None   Objective: Filed Vitals:   12/25/13 0553  BP: 105/59  Pulse: 65  Temp: 97.4 F (36.3 C)  Resp: 15   No intake or output data in the 24 hours ending 12/25/13 1130 Filed Weights   12/22/13 1805  Weight: 80.151 kg (176 lb 11.2 oz)    Exam: General: Alert and awake, oriented x3, not in any acute distress. HEENT: anicteric sclera, pupils  reactive to light and accommodation, EOMI CVS: S1-S2 clear, no murmur rubs or gallops Chest: clear to auscultation bilaterally, no wheezing, rales or rhonchi Abdomen: soft nontender, nondistended, normal bowel sounds, no organomegaly Extremities: no cyanosis, clubbing or edema noted bilaterally Neuro: Cranial nerves II-XII intact, no focal neurological deficits  Data Reviewed: Basic Metabolic Panel:  Recent Labs Lab 12/22/13 2115 12/23/13 0516  NA 136* 136*  K 3.5* 3.6*  CL 99 101  CO2 24 21  GLUCOSE 100* 103*  BUN 13 11  CREATININE 0.87 0.81  CALCIUM 9.7 9.4  MG 2.0  --   PHOS 3.2  --    Liver Function Tests:  Recent Labs Lab 12/22/13 2115  AST 16  ALT 9  ALKPHOS 38*  BILITOT <0.2*  PROT 8.2  ALBUMIN 3.0*   No results found for this basename: LIPASE, AMYLASE,  in the last 168 hours No results found for this basename: AMMONIA,  in the last 168 hours CBC:  Recent Labs Lab 12/22/13 2115 12/23/13 0516 12/24/13 0503 12/25/13 0628  WBC 8.3 7.7 5.7 5.4  HGB 13.5 12.8 12.7 12.6  HCT 39.6 38.5 38.0 37.2  MCV 93.8 96.7 95.5 94.2  PLT 427* 424* 396 408*   Cardiac Enzymes: No results found for this basename: CKTOTAL, CKMB, CKMBINDEX, TROPONINI,  in the last 168 hours BNP (last 3 results) No results found for this basename: PROBNP,  in the last 8760 hours CBG: No results found for this basename: GLUCAP,  in the last 168 hours  Micro No results found for this or any previous visit (from the past 240 hour(s)).  Studies: No results found.  Scheduled Meds: . docusate sodium  100 mg Oral Daily  . Influenza vac split quadrivalent PF  0.5 mL Intramuscular Tomorrow-1000  . sodium chloride  3 mL Intravenous Q12H  . sodium chloride  3 mL Intravenous Q12H   Continuous Infusions: . heparin 1,100 Units/hr (12/25/13 0541)       Time spent: 35 minutes    [facility] IncELMAHI,Shavell Nored A  [facility] Pager [phone] If 7PM-7AM, please contact night-coverage at  [email or web address], password [email or web address] 12/25/2013, 11:30 AM  LOS: 3 days

## 2013-12-25 NOTE — Progress Notes (Signed)
12/25/13  Pharmacy-  Heparin 2105   APTT 113sec on 1250 units/hr  A/P:  27yo female with PE, DVT on heparin bridge while Xarelto on hold for thrombolysis procedure.  Heparin adjusted this AM for aPTT slightly below goal and now above goal.  No bleeding noted nor issues with IV per d/w RN.  We are monitoring aPTTs as heparin levels may be skewed by recent Xarelto.  1-  Decrease Heparin to 1150 units/hr 2-  Check aPTT in 6hr  Marisue HumbleKendra Fitzpatrick Alberico, PharmD Clinical Pharmacist [facility]

## 2013-12-25 NOTE — Progress Notes (Signed)
ANTICOAGULATION CONSULT NOTE - Follow Up Consult  Pharmacy Consult for Heparin  Indication: pulmonary embolus, DVT  Allergies  Allergen Reactions  . Triaminic Anaphylaxis and Hives    Vital Signs: Temp: 97.4 F (36.3 C) (10/18 0553) Temp Source: Oral (10/18 0553) BP: 105/59 mmHg (10/18 0553) Pulse Rate: 65 (10/18 0553)  Labs:  Recent Labs  12/22/13 2115 12/23/13 0516 12/24/13 0503 12/24/13 0833 12/25/13 0628 12/25/13 1130  HGB 13.5 12.8 12.7  --  12.6  --   HCT 39.6 38.5 38.0  --  37.2  --   PLT 427* 424* 396  --  408*  --   APTT 33 69*  --   --   --  65*  LABPROT  --   --   --  17.2*  --   --   INR  --   --   --  1.39  --   --   HEPARINUNFRC  --  0.36  --   --   --   --   CREATININE 0.87 0.81  --   --   --   --     Estimated Creatinine Clearance: 113.5 ml/min (by C-G formula based on Cr of 0.81).  Assessment: Heparin resumed this morning and Xarelto is on hold for thrombolysis procedure..  Thrombylysis planned 10/19 so Xarelto is on hold.  Heparin started 10/18 and initial PTT is 65s which is slightly low.  We are checking PTTs as heparin levels can be elevated with recent Xarelto use.  Goal of Therapy:  PTT 66-102s Monitor platelets by anticoagulation protocol: Yes   Plan:  -Increase heparin to 1250 units/hr  -PTT in 6 hours -Daily CBC/PTT daily -Monitor for bleeding  -thrombolysis planned for 10/19  Mickeal SkinnerFrens, Joangel Vanosdol John 12/25/2013,1:06 PM

## 2013-12-25 NOTE — Progress Notes (Signed)
Patient ID: Vanessa MonteJasmine B [name], female   DOB: 04/20/1985, 28 y.o.   MRN: [id number] I discussed Xarelto and starting thrombolytic therapy with the pharmacist.  There does not appear to good data suggesting safety of thrombolytics and Xarelto.  According to pharmacy, the Xarelto may not be completely out of the patient's system at 24 hours although it is safe to proceed with minor surgical procedures.  I discussed this information with the patient and her family and we have decided to wait one more day and plan for thrombolysis on Monday, Oct. 19th.  Will continue IV heparin.

## 2013-12-26 ENCOUNTER — Inpatient Hospital Stay (HOSPITAL_COMMUNITY)

## 2013-12-26 LAB — CBC
HCT: 37.3 % (ref 36.0–46.0)
HEMOGLOBIN: 12.6 g/dL (ref 12.0–15.0)
MCH: 32.1 pg (ref 26.0–34.0)
MCHC: 33.8 g/dL (ref 30.0–36.0)
MCV: 95.2 fL (ref 78.0–100.0)
Platelets: 346 10*3/uL (ref 150–400)
RBC: 3.92 MIL/uL (ref 3.87–5.11)
RDW: 12.5 % (ref 11.5–15.5)
WBC: 6.4 10*3/uL (ref 4.0–10.5)

## 2013-12-26 LAB — CARDIOLIPIN ANTIBODIES, IGG, IGM, IGA
ANTICARDIOLIPIN IGM: 5 [MPL'U]/mL — AB (ref <11)
Anticardiolipin IgA: 7 APL U/mL — ABNORMAL LOW (ref <22)
Anticardiolipin IgG: 6 GPL U/mL — ABNORMAL LOW (ref <23)

## 2013-12-26 LAB — BASIC METABOLIC PANEL
ANION GAP: 12 (ref 5–15)
BUN: 12 mg/dL (ref 6–23)
CHLORIDE: 100 meq/L (ref 96–112)
CO2: 21 mEq/L (ref 19–32)
CREATININE: 0.82 mg/dL (ref 0.50–1.10)
Calcium: 9 mg/dL (ref 8.4–10.5)
GFR calc Af Amer: >90 mL/min (ref >90)
GFR calc non Af Amer: >90 mL/min (ref >90)
Glucose, Bld: 89 mg/dL (ref 70–99)
Potassium: 4.2 mEq/L (ref 3.7–5.3)
Sodium: 133 mEq/L — ABNORMAL LOW (ref 137–147)

## 2013-12-26 LAB — LUPUS ANTICOAGULANT PANEL
DRVVT: 40.5 s (ref <42.9)
Lupus Anticoagulant: NOT DETECTED
PTT Lupus Anticoagulant: 62.9 secs — ABNORMAL HIGH (ref 28.0–43.0)
PTTLA 41 MIX: 65.8 s — AB (ref 28.0–43.0)
PTTLA Confirmation: 5.8 secs (ref <8.0)

## 2013-12-26 LAB — PROTEIN C ACTIVITY: PROTEIN C ACTIVITY: 186 % — AB (ref 75–133)

## 2013-12-26 LAB — BETA-2-GLYCOPROTEIN I ABS, IGG/M/A
BETA 2 GLYCO I IGG: 0 G Units (ref <20)
Beta-2-Glycoprotein I IgA: 1 A Units (ref <20)
Beta-2-Glycoprotein I IgM: 5 M Units (ref <20)

## 2013-12-26 LAB — PROTEIN S ACTIVITY: Protein S Activity: 80 % (ref 69–129)

## 2013-12-26 LAB — PROTEIN S, TOTAL: PROTEIN S AG TOTAL: 76 % (ref 60–150)

## 2013-12-26 LAB — PROTEIN C, TOTAL: Protein C, Total: 109 % (ref 72–160)

## 2013-12-26 LAB — APTT: APTT: 109 s — AB (ref 24–37)

## 2013-12-26 MED ORDER — FENTANYL CITRATE 0.05 MG/ML IJ SOLN
INTRAMUSCULAR | Status: AC
  Filled 2013-12-26: qty 2

## 2013-12-26 MED ORDER — SODIUM CHLORIDE 0.9 % IV SOLN
12.0000 mg | Freq: Once | INTRAVENOUS | Status: AC
  Administered 2013-12-26: 12 mg via INTRAVENOUS
  Filled 2013-12-26: qty 12

## 2013-12-26 MED ORDER — SODIUM CHLORIDE 0.9 % IV SOLN
INTRAVENOUS | Status: AC
  Administered 2013-12-26: 15:00:00 via INTRAVENOUS

## 2013-12-26 MED ORDER — LIDOCAINE HCL 1 % IJ SOLN
INTRAMUSCULAR | Status: AC
  Filled 2013-12-26: qty 20

## 2013-12-26 MED ORDER — IOHEXOL 300 MG/ML  SOLN
100.0000 mL | Freq: Once | INTRAMUSCULAR | Status: AC | PRN
Start: 2013-12-26 — End: 2013-12-26
  Administered 2013-12-26: 80 mL via INTRAVENOUS

## 2013-12-26 MED ORDER — FENTANYL CITRATE 0.05 MG/ML IJ SOLN
INTRAMUSCULAR | Status: AC | PRN
  Administered 2013-12-26: 25 ug via INTRAVENOUS
  Administered 2013-12-26 (×2): 50 ug via INTRAVENOUS
  Administered 2013-12-26: 25 ug via INTRAVENOUS

## 2013-12-26 MED ORDER — MIDAZOLAM HCL 2 MG/2ML IJ SOLN
INTRAMUSCULAR | Status: AC
  Filled 2013-12-26: qty 2

## 2013-12-26 MED ORDER — MIDAZOLAM HCL 2 MG/2ML IJ SOLN
INTRAMUSCULAR | Status: AC | PRN
  Administered 2013-12-26 (×3): 1 mg via INTRAVENOUS

## 2013-12-26 MED ORDER — ALTEPLASE 100 MG IV SOLR
12.0000 mg | Freq: Once | INTRAVENOUS | Status: AC
  Administered 2013-12-26: 12 mg via INTRAVENOUS
  Filled 2013-12-26: qty 12

## 2013-12-26 NOTE — Sedation Documentation (Signed)
6Fr. Sheath removed and pressure held at site. Dressing applied.

## 2013-12-26 NOTE — Progress Notes (Signed)
Called interventional radiology and clarified that heparin gtt would not have to be stopped before thrombolysis procedure. Will call back after 8am when radiologist is here.

## 2013-12-26 NOTE — Progress Notes (Signed)
On-call provider notified of DBP <60. No complaints of distress from patient. No new orders given. Will continue to monitor.

## 2013-12-26 NOTE — Procedures (Signed)
Interventional Radiology Procedure Note  Procedure: Right lower extremity thrombolysis/thrombectomy, with 12 mg tPA infused with Pulse-spray (Angiojet), and subsequent mechanical thrombolysis (balloon), and Angiojet thrombectomy.   Findings:  Thrombosed right femoral and popliteal vein --> patent right femoral and popliteal vein after thrombectomy.   Access was a the right popliteal vein.  Complications: No immediate Recommendations:  Continue anti-coagulation.  Prone for 3 hours.  Regular diet   Signed,  Yvone NeuJaime S. Loreta AveWagner, DO

## 2013-12-26 NOTE — Progress Notes (Signed)
ANTICOAGULATION CONSULT NOTE   Pharmacy Consult for Heparin  Indication: pulmonary embolus, DVT  Allergies  Allergen Reactions  . Triaminic Anaphylaxis and Hives    Vital Signs: Temp: 98.1 F (36.7 C) (10/18 2238) Temp Source: Oral (10/18 2238) BP: 107/58 mmHg (10/18 2238) Pulse Rate: 68 (10/18 2238)  Labs:  Recent Labs  12/23/13 0516 12/24/13 0503 12/24/13 0833 12/25/13 0628 12/25/13 1130 12/25/13 1914 12/26/13 0326  HGB 12.8 12.7  --  12.6  --   --  12.6  HCT 38.5 38.0  --  37.2  --   --  37.3  PLT 424* 396  --  408*  --   --  346  APTT 69*  --   --   --  65* 113* 109*  LABPROT  --   --  17.2*  --   --   --   --   INR  --   --  1.39  --   --   --   --   HEPARINUNFRC 0.36  --   --   --   --   --   --   CREATININE 0.81  --   --   --   --   --  0.82    Estimated Creatinine Clearance: 112.1 ml/min (by C-G formula based on Cr of 0.82).  Assessment: 28 yo female with DVT, Xarelto on hold for thrombolysis, for heparin  Goal of Therapy:  PTT 66-102s Monitor platelets by anticoagulation protocol: Yes   Plan:  Decrease Heparin 1050 units/hr F/U after IR today   Regan Llorente, Gary FleetGregory Vernon 12/26/2013,5:09 AM

## 2013-12-26 NOTE — Progress Notes (Signed)
Contacted IV team about 19 or 21g butterfly in foot. IV team does not feel comfortable starting a 18 or 20g in foot. Will notify radiology.

## 2013-12-26 NOTE — Progress Notes (Signed)
IV team unable to obtain IV in right foot. Interventional radiology notified. They will notify the radiologist.

## 2013-12-26 NOTE — Progress Notes (Signed)
Pt received from IR. VSS. Telemetry reapplied. Site behind right knee with no bleeding, no hemotoma, no bruising. Tenderness only.

## 2013-12-26 NOTE — Progress Notes (Signed)
[facility] PROGRESS NOTE   Vanessa MonteJasmine B [name] NUU:[id number] DOB: 02/28/1986 DOA: 12/22/2013 PCP: No PCP Per Patient  HPI/Subjective: See with the mother at bedside, thrombolysis/thrombectomy scheduled for today.  Assessment/Plan: Principal Problem:   Pulmonary embolism Active Problems:   DVT, femoral, acute   Bucket handle tear of lateral meniscus of right knee    Acute PE -Patient started on IV heparin upon admission to the hospital. -Back on heparin, Xarelto hold, last dose 10/17 at 8:32 AM -Patient was on OCPs, and recent injury/inflammation to the right knee. -Hypercoagulability panel obtained prior to initiation of IV heparin. -Patient denies any significant chest pain, no hypoxia or presyncopal symptoms. -2-D echocardiogram was done and showed no evidence of right heart strain.  Acute DVT -Involving the right lower extremity, the femoral and popliteal veins. -Seen by vascular surgery, VIR for thrombolysis. -Thrombolysis/thrombectomy done, heparin continued.  Right knee meniscal tear -Patient has bucket handle tear of the lateral meniscus of right knee, patient is followed by Dr. Roda ShuttersXu.   Code Status: Full code Family Communication: Plan discussed with the patient. Disposition Plan: Remains inpatient   Consultants:  Dr. Myra GianottiBrabham  Procedures:  None  Antibiotics:  None   Objective: Filed Vitals:   12/26/13 1421  BP: 122/59  Pulse: 66  Temp:   Resp: 13    Intake/Output Summary (Last 24 hours) at 12/26/13 1534 Last data filed at 12/26/13 1423  Gross per 24 hour  Intake      3 ml  Output      0 ml  Net      3 ml   Filed Weights   12/22/13 1805  Weight: 80.151 kg (176 lb 11.2 oz)    Exam: General: Alert and awake, oriented x3, not in any acute distress. HEENT: anicteric sclera, pupils reactive to light and accommodation, EOMI CVS: S1-S2 clear, no murmur rubs or gallops Chest: clear to auscultation bilaterally, no wheezing, rales or  rhonchi Abdomen: soft nontender, nondistended, normal bowel sounds, no organomegaly Extremities: no cyanosis, clubbing or edema noted bilaterally Neuro: Cranial nerves II-XII intact, no focal neurological deficits  Data Reviewed: Basic Metabolic Panel:  Recent Labs Lab 12/22/13 2115 12/23/13 0516 12/26/13 0326  NA 136* 136* 133*  K 3.5* 3.6* 4.2  CL 99 101 100  CO2 24 21 21   GLUCOSE 100* 103* 89  BUN 13 11 12   CREATININE 0.87 0.81 0.82  CALCIUM 9.7 9.4 9.0  MG 2.0  --   --   PHOS 3.2  --   --    Liver Function Tests:  Recent Labs Lab 12/22/13 2115  AST 16  ALT 9  ALKPHOS 38*  BILITOT <0.2*  PROT 8.2  ALBUMIN 3.0*   No results found for this basename: LIPASE, AMYLASE,  in the last 168 hours No results found for this basename: AMMONIA,  in the last 168 hours CBC:  Recent Labs Lab 12/22/13 2115 12/23/13 0516 12/24/13 0503 12/25/13 0628 12/26/13 0326  WBC 8.3 7.7 5.7 5.4 6.4  HGB 13.5 12.8 12.7 12.6 12.6  HCT 39.6 38.5 38.0 37.2 37.3  MCV 93.8 96.7 95.5 94.2 95.2  PLT 427* 424* 396 408* 346   Cardiac Enzymes: No results found for this basename: CKTOTAL, CKMB, CKMBINDEX, TROPONINI,  in the last 168 hours BNP (last 3 results) No results found for this basename: PROBNP,  in the last 8760 hours CBG: No results found for this basename: GLUCAP,  in the last 168 hours  Micro No results found for this  or any previous visit (from the past 240 hour(s)).   Studies: No results found.  Scheduled Meds: . alteplase (TPA-ACTIVASE) *DIALYSIS CATH* 10 mg infusion  12 mg Intravenous Once  . docusate sodium  100 mg Oral Daily  . fentaNYL      . fentaNYL      . Influenza vac split quadrivalent PF  0.5 mL Intramuscular Tomorrow-1000  . lidocaine      . midazolam      . midazolam      . sodium chloride  3 mL Intravenous Q12H  . sodium chloride  3 mL Intravenous Q12H   Continuous Infusions: . sodium chloride 125 mL/hr at 12/26/13 1521  . heparin 1,050 Units/hr  (12/26/13 0520)       Time spent: 35 minutes    [facility] CenterELMAHI,Clarine Elrod A  [facility] Pager [phone] If 7PM-7AM, please contact night-coverage at [email or web address], password [email or web address] 12/26/2013, 3:34 PM  LOS: 4 days

## 2013-12-27 LAB — CBC
HCT: 37.6 % (ref 36.0–46.0)
Hemoglobin: 12.5 g/dL (ref 12.0–15.0)
MCH: 32.6 pg (ref 26.0–34.0)
MCHC: 33.2 g/dL (ref 30.0–36.0)
MCV: 97.9 fL (ref 78.0–100.0)
Platelets: 334 10*3/uL (ref 150–400)
RBC: 3.84 MIL/uL — ABNORMAL LOW (ref 3.87–5.11)
RDW: 12.6 % (ref 11.5–15.5)
WBC: 5.8 10*3/uL (ref 4.0–10.5)

## 2013-12-27 LAB — APTT: aPTT: 99 seconds — ABNORMAL HIGH (ref 24–37)

## 2013-12-27 LAB — HEPARIN LEVEL (UNFRACTIONATED): Heparin Unfractionated: 0.61 IU/mL (ref 0.30–0.70)

## 2013-12-27 MED ORDER — OXYCODONE-ACETAMINOPHEN 5-325 MG PO TABS: 1.0000 | ORAL_TABLET | ORAL | Status: DC | PRN

## 2013-12-27 MED ORDER — RIVAROXABAN 15 MG PO TABS
15.0000 mg | ORAL_TABLET | Freq: Two times a day (BID) | ORAL | Status: DC
  Administered 2013-12-27: 15 mg via ORAL
  Filled 2013-12-27 (×3): qty 1

## 2013-12-27 NOTE — Progress Notes (Signed)
Pt given discharge instructions, prescriptions, and PIV removed.  Pt taken to discharge location via wheelchair.

## 2013-12-27 NOTE — Progress Notes (Signed)
ANTICOAGULATION CONSULT NOTE - Follow Up Consult  Pharmacy Consult for Heparin  Indication: pulmonary embolus, DVT  Allergies  Allergen Reactions  . Triaminic Anaphylaxis and Hives    Vital Signs: Temp: 98.2 F (36.8 C) (10/20 0713) Temp Source: Oral (10/20 0713) BP: 102/50 mmHg (10/20 0713) Pulse Rate: 58 (10/20 0713)  Labs:  Recent Labs  12/25/13 0628  12/25/13 1914 12/26/13 0326 12/27/13 0636  HGB 12.6  --   --  12.6 12.5  HCT 37.2  --   --  37.3 37.6  PLT 408*  --   --  346 334  APTT  --   < > 113* 109* 99*  HEPARINUNFRC  --   --   --   --  0.61  CREATININE  --   --   --  0.82  --   < > = values in this interval not displayed.  Estimated Creatinine Clearance: 112.1 ml/min (by C-G formula based on Cr of 0.82).  Assessment: PE/DVT s/p thrombolysis 10/19, 12mg  of tpa given in IR.   Patient continues on IV heparin with therapeutic goal. No bleeding issues noted, CBC stable.  Goal of Therapy:  HL 0.3-0.7 Monitor platelets by anticoagulation protocol: Yes   Plan:  -heparin to 1050 units/hr  -Daily CBC/HL daily -Monitor for bleeding   Sheppard CoilFrank Brynda [name] PharmD., BCPS Clinical Pharmacist Pager [phone] 12/27/2013 9:28 AM

## 2013-12-27 NOTE — Progress Notes (Signed)
Patient complained of tingling in right foot and toes. Dorsalis pedis pulses present and strong, foot warm and skin color appropriate for ethnicity. Leg was repositioned and patient stated that her sensation returned to normal. Will continue to monitor.

## 2013-12-27 NOTE — Discharge Summary (Signed)
Physician Discharge Summary  [name] NUU:[id number] DOB: 11/27/1985 DOA: 12/22/2013  PCP: No PCP Per Patient  Admit date: 12/22/2013 Discharge date: 12/27/2013  Time spent: 40 minutes  Recommendations for Outpatient Follow-up:  1. Followup with primary care physician where we  Discharge Diagnoses:  Principal Problem:   Pulmonary embolism Active Problems:   DVT, femoral, acute   Bucket handle tear of lateral meniscus of right knee   Discharge Condition: Stable  Diet recommendation: Soft  Filed Weights   12/22/13 1805  Weight: 80.151 kg (176 lb 11.2 oz)    History of present illness:  [name] is a 28 y.o. female, with past medical history significant for recently diagnosed DVT last Sunday placed on Xarelto, presented today to PMD Dr. Providence LaniusHowell . Patient had CT of the chest which showed right lung pulmonary embolism and patient was sent here for management. Patient reports 2 weeks history of shortness of breath but no chest pains. Feels okay today with no chest pains, increased shortness of breath or lower extremity pain. Patient has family history of DVT in her cousins . No previous history of DVTs . Patient was followed by Dr. Roda ShuttersXu for right knee meniscal tear and probable surgery.  Hospital Course:   Acute PE  -Patient started on IV heparin upon admission to the hospital.  -VTE presumed to be secondary to OCPs, and recent injury/inflammation to the right knee.  -Hypercoagulability panel obtained and results pending. -Patient denies any significant chest pain, no hypoxia or presyncopal symptoms.  -2-D echocardiogram was done and showed no evidence of right heart strain.  -Patient started on IV heparin and discharged back on her Xarelto.  Acute DVT  -Involving the right lower extremity, the femoral and popliteal veins.  -Patient and her mother requested vascular consultation for consideration of thrombectomy. -Seen by vascular surgery, recommended VIR consult for  thrombolysis, patient and her family elected to do thrombolysis. -Thrombolysis/thrombectomy done on 10/19, heparin continued.  -No complaints today, no bleeding, discharge home on Xarelto.  Right knee meniscal tear  -Patient has bucket handle tear of the lateral meniscus of right knee, patient is followed by Dr. Roda ShuttersXu.    Procedures:  12/26/13 Dr. Loreta AveWagner: Right lower extremity thrombolysis/thrombectomy, with 12 mg tPA infused with Pulse-spray (Angiojet), and subsequent mechanical thrombolysis (balloon), and Angiojet thrombectomy.  Consultations:  Vascular surgery.  Vascular interventional radiology  Discharge Exam: Filed Vitals:   12/27/13 0713  BP: 102/50  Pulse: 58  Temp: 98.2 F (36.8 C)  Resp: 13   General: Alert and awake, oriented x3, not in any acute distress. HEENT: anicteric sclera, pupils reactive to light and accommodation, EOMI CVS: S1-S2 clear, no murmur rubs or gallops Chest: clear to auscultation bilaterally, no wheezing, rales or rhonchi Abdomen: soft nontender, nondistended, normal bowel sounds, no organomegaly Extremities: no cyanosis, clubbing or edema noted bilaterally Neuro: Cranial nerves II-XII intact, no focal neurological deficits  Discharge Instructions You were cared for by a hospitalist during your hospital stay. If you have any questions about your discharge medications or the care you received while you were in the hospital after you are discharged, you can call the unit and asked to speak with the hospitalist on call if the hospitalist that took care of you is not available. Once you are discharged, your primary care physician will handle any further medical issues. Please note that NO REFILLS for any discharge medications will be authorized once you are discharged, as it is imperative that you return to your primary  care physician (or establish a relationship with a primary care physician if you do not have one) for your aftercare needs so that they  can reassess your need for medications and monitor your lab values.  Discharge Instructions   Increase activity slowly    Complete by:  As directed           Current Discharge Medication List    CONTINUE these medications which have NOT CHANGED   Details  acetaminophen (TYLENOL) 500 MG tablet Take 500 mg by mouth every 8 (eight) hours as needed.    Rivaroxaban (XARELTO) 15 MG TABS tablet Take 15 mg by mouth 2 (two) times daily with a meal.      STOP taking these medications     Norethin Ace-Eth Estrad-FE (JUNEL FE 1/20 PO)      cyclobenzaprine (FLEXERIL) 5 MG tablet        Allergies  Allergen Reactions  . Triaminic Anaphylaxis and Hives      The results of significant diagnostics from this hospitalization (including imaging, microbiology, ancillary and laboratory) are listed below for reference.    Significant Diagnostic Studies: Ir Veno/ext/uni Right  12/26/2013   CLINICAL DATA:  28 year old female with right meniscus injury. The patient has been immobile and has developed right femoral popliteal thrombus/DVT. She has also been using oral contraceptives, placing her at further risk.  She stands to benefit from thrombolysis given her risk of development of chronic symptoms of post thrombotic syndrome.  EXAM: RIGHT LOWER EXTREMITY VENOGRAM.  ULTRASOUND GUIDED ACCESS OF RIGHT POPLITEAL VEIN.  MECHANICAL AND PHARMACOLOGIC THROMBOLYSIS/THROMBECTOMY OF RIGHT FEMORAL POPLITEAL DVT.  FLUOROSCOPY TIME:  7 MIN, 30 SECONDS  MEDICATIONS: 3.0 mg Versed, 150 mcg fentanyl  ACCESS: Right popliteal vein  TECHNIQUE: A complete informed consent with all the risks and benefits was performed for the patient and the patient's family before initiating the procedure. The patient and the patient's family agreed to proceeding given their low risk of bleeding and potential benefit of the procedure.  The patient is placed in the prone position on the fluoroscopy table an the right light was prepped and  draped in usual sterile fashion.  Ultrasound survey of the right posterior tibial vein was performed with a small vein identified. Ultrasound survey of the right popliteal region was then performed with images stored sent to PACs.  The skin and subcutaneous tissues overlying the right popliteal vein were generously infiltrated with 1% lidocaine for local anesthesia. A 21 gauge micropuncture needle was then used access the popliteal vein. With blood flow returned, a 0.018 micro wire was advanced into the popliteal vein. The needle was removed and a micropuncture sheath was placed over the wire. Initial venogram was performed.  Serial exchange to an 0.035 Bentson wire was performed. We then exchanged for a Glidewire.  The 6 French sheath was then placed over the Glidewire. A 5 French catheter and the Glidewire were navigated into the iliac vein. We confirmed patency of the right iliac system and the IVC, and then placed a 0.035 Bentson wire into the femoral vein/iliac vein for ray all for Angiojet.  Pole spray technique was then used to infused 12 mg of tPA in 100 cc of normal saline under fluoroscopy.  We then used in 8 mm x 4 cm balloon for balloon maceration along the length of a clot. The thrombectomy function was then performed with the Angiojet on the wire, with a final angiogram performed.  Catheters wires and sheaths were all removed.  Patient tolerated the procedure well and remained hemodynamically stable throughout.  No complications were encountered and no significant blood loss was encountered.  IMPRESSION: 1. Status post right lower extremity venogram after access of the popliteal vein. Initial images demonstrate femoral popliteal thrombus, compatible with DVT on prior ultrasound. 2. Status post pharmacologic, mechanical, and aspiration thrombectomy, there has been clearing of the right femoral popliteal venous thrombus with excellent flow through the fem-pop segment.  PLAN: Agree with continuing anti  coagulation.  Follow-up clinic appointment with Dr. Loreta Ave or Dr. Lowella Dandy in 3-4 weeks with repeat duplex ultrasound.  Signed,  Yvone Neu. Loreta Ave, DO  [facility]  [facility]   Electronically Signed   By: Gilmer Mor D.O.   On: 12/26/2013 16:54   Ir Pta Venous Right  12/26/2013   CLINICAL DATA:  28 year old female with right meniscus injury. The patient has been immobile and has developed right femoral popliteal thrombus/DVT. She has also been using oral contraceptives, placing her at further risk.  She stands to benefit from thrombolysis given her risk of development of chronic symptoms of post thrombotic syndrome.  EXAM: RIGHT LOWER EXTREMITY VENOGRAM.  ULTRASOUND GUIDED ACCESS OF RIGHT POPLITEAL VEIN.  MECHANICAL AND PHARMACOLOGIC THROMBOLYSIS/THROMBECTOMY OF RIGHT FEMORAL POPLITEAL DVT.  FLUOROSCOPY TIME:  7 MIN, 30 SECONDS  MEDICATIONS: 3.0 mg Versed, 150 mcg fentanyl  ACCESS: Right popliteal vein  TECHNIQUE: A complete informed consent with all the risks and benefits was performed for the patient and the patient's family before initiating the procedure. The patient and the patient's family agreed to proceeding given their low risk of bleeding and potential benefit of the procedure.  The patient is placed in the prone position on the fluoroscopy table an the right light was prepped and draped in usual sterile fashion.  Ultrasound survey of the right posterior tibial vein was performed with a small vein identified. Ultrasound survey of the right popliteal region was then performed with images stored sent to PACs.  The skin and subcutaneous tissues overlying the right popliteal vein were generously infiltrated with 1% lidocaine for local anesthesia. A 21 gauge micropuncture needle was then used access the popliteal vein. With blood flow returned, a 0.018 micro wire was advanced into the popliteal vein. The needle was removed and a micropuncture sheath was placed  over the wire. Initial venogram was performed.  Serial exchange to an 0.035 Bentson wire was performed. We then exchanged for a Glidewire.  The 6 French sheath was then placed over the Glidewire. A 5 French catheter and the Glidewire were navigated into the iliac vein. We confirmed patency of the right iliac system and the IVC, and then placed a 0.035 Bentson wire into the femoral vein/iliac vein for ray all for Angiojet.  Pole spray technique was then used to infused 12 mg of tPA in 100 cc of normal saline under fluoroscopy.  We then used in 8 mm x 4 cm balloon for balloon maceration along the length of a clot. The thrombectomy function was then performed with the Angiojet on the wire, with a final angiogram performed.  Catheters wires and sheaths were all removed.  Patient tolerated the procedure well and remained hemodynamically stable throughout.  No complications were encountered and no significant blood loss was encountered.  IMPRESSION: 1. Status post right lower extremity venogram after access of the popliteal vein. Initial images demonstrate femoral popliteal thrombus, compatible with DVT on prior ultrasound. 2. Status post pharmacologic, mechanical, and aspiration thrombectomy, there has  been clearing of the right femoral popliteal venous thrombus with excellent flow through the fem-pop segment.  PLAN: Agree with continuing anti coagulation.  Follow-up clinic appointment with Dr. Loreta Ave or Dr. Lowella Dandy in 3-4 weeks with repeat duplex ultrasound.  Signed,  Yvone Neu. Loreta Ave, DO  [facility]  [facility]   Electronically Signed   By: Gilmer Mor D.O.   On: 12/26/2013 16:54   Ir US Guide Vasc Access Right  12/26/2013   CLINICAL DATA:  28 year old female with right meniscus injury. The patient has been immobile and has developed right femoral popliteal thrombus/DVT. She has also been using oral contraceptives, placing her at further risk.  She stands to benefit  from thrombolysis given her risk of development of chronic symptoms of post thrombotic syndrome.  EXAM: RIGHT LOWER EXTREMITY VENOGRAM.  ULTRASOUND GUIDED ACCESS OF RIGHT POPLITEAL VEIN.  MECHANICAL AND PHARMACOLOGIC THROMBOLYSIS/THROMBECTOMY OF RIGHT FEMORAL POPLITEAL DVT.  FLUOROSCOPY TIME:  7 MIN, 30 SECONDS  MEDICATIONS: 3.0 mg Versed, 150 mcg fentanyl  ACCESS: Right popliteal vein  TECHNIQUE: A complete informed consent with all the risks and benefits was performed for the patient and the patient's family before initiating the procedure. The patient and the patient's family agreed to proceeding given their low risk of bleeding and potential benefit of the procedure.  The patient is placed in the prone position on the fluoroscopy table an the right light was prepped and draped in usual sterile fashion.  Ultrasound survey of the right posterior tibial vein was performed with a small vein identified. Ultrasound survey of the right popliteal region was then performed with images stored sent to PACs.  The skin and subcutaneous tissues overlying the right popliteal vein were generously infiltrated with 1% lidocaine for local anesthesia. A 21 gauge micropuncture needle was then used access the popliteal vein. With blood flow returned, a 0.018 micro wire was advanced into the popliteal vein. The needle was removed and a micropuncture sheath was placed over the wire. Initial venogram was performed.  Serial exchange to an 0.035 Bentson wire was performed. We then exchanged for a Glidewire.  The 6 French sheath was then placed over the Glidewire. A 5 French catheter and the Glidewire were navigated into the iliac vein. We confirmed patency of the right iliac system and the IVC, and then placed a 0.035 Bentson wire into the femoral vein/iliac vein for ray all for Angiojet.  Pole spray technique was then used to infused 12 mg of tPA in 100 cc of normal saline under fluoroscopy.  We then used in 8 mm x 4 cm balloon for  balloon maceration along the length of a clot. The thrombectomy function was then performed with the Angiojet on the wire, with a final angiogram performed.  Catheters wires and sheaths were all removed.  Patient tolerated the procedure well and remained hemodynamically stable throughout.  No complications were encountered and no significant blood loss was encountered.  IMPRESSION: 1. Status post right lower extremity venogram after access of the popliteal vein. Initial images demonstrate femoral popliteal thrombus, compatible with DVT on prior ultrasound. 2. Status post pharmacologic, mechanical, and aspiration thrombectomy, there has been clearing of the right femoral popliteal venous thrombus with excellent flow through the fem-pop segment.  PLAN: Agree with continuing anti coagulation.  Follow-up clinic appointment with Dr. Loreta Ave or Dr. Lowella Dandy in 3-4 weeks with repeat duplex ultrasound.  Signed,  Yvone Neu. Loreta Ave, DO  [facility]  [facility]  Electronically Signed   By: Gilmer Mor D.O.   On: 12/26/2013 16:54   Dg Knee Complete 4 Views Right  12/03/2013   CLINICAL DATA:  Right knee pain.  EXAM: RIGHT KNEE - COMPLETE 4+ VIEW  COMPARISON:  None.  FINDINGS: There is no evidence of fracture, dislocation, or joint effusion. There is no evidence of arthropathy or other focal bone abnormality. Soft tissues are unremarkable.  IMPRESSION: Negative.   Electronically Signed   By: Maisie Fus  Register   On: 12/03/2013 16:36   Ir Infusion Thrombol Venous Initial (ms)  12/26/2013   CLINICAL DATA:  28 year old female with right meniscus injury. The patient has been immobile and has developed right femoral popliteal thrombus/DVT. She has also been using oral contraceptives, placing her at further risk.  She stands to benefit from thrombolysis given her risk of development of chronic symptoms of post thrombotic syndrome.  EXAM: RIGHT LOWER EXTREMITY VENOGRAM.  ULTRASOUND GUIDED  ACCESS OF RIGHT POPLITEAL VEIN.  MECHANICAL AND PHARMACOLOGIC THROMBOLYSIS/THROMBECTOMY OF RIGHT FEMORAL POPLITEAL DVT.  FLUOROSCOPY TIME:  7 MIN, 30 SECONDS  MEDICATIONS: 3.0 mg Versed, 150 mcg fentanyl  ACCESS: Right popliteal vein  TECHNIQUE: A complete informed consent with all the risks and benefits was performed for the patient and the patient's family before initiating the procedure. The patient and the patient's family agreed to proceeding given their low risk of bleeding and potential benefit of the procedure.  The patient is placed in the prone position on the fluoroscopy table an the right light was prepped and draped in usual sterile fashion.  Ultrasound survey of the right posterior tibial vein was performed with a small vein identified. Ultrasound survey of the right popliteal region was then performed with images stored sent to PACs.  The skin and subcutaneous tissues overlying the right popliteal vein were generously infiltrated with 1% lidocaine for local anesthesia. A 21 gauge micropuncture needle was then used access the popliteal vein. With blood flow returned, a 0.018 micro wire was advanced into the popliteal vein. The needle was removed and a micropuncture sheath was placed over the wire. Initial venogram was performed.  Serial exchange to an 0.035 Bentson wire was performed. We then exchanged for a Glidewire.  The 6 French sheath was then placed over the Glidewire. A 5 French catheter and the Glidewire were navigated into the iliac vein. We confirmed patency of the right iliac system and the IVC, and then placed a 0.035 Bentson wire into the femoral vein/iliac vein for ray all for Angiojet.  Pole spray technique was then used to infused 12 mg of tPA in 100 cc of normal saline under fluoroscopy.  We then used in 8 mm x 4 cm balloon for balloon maceration along the length of a clot. The thrombectomy function was then performed with the Angiojet on the wire, with a final angiogram performed.   Catheters wires and sheaths were all removed.  Patient tolerated the procedure well and remained hemodynamically stable throughout.  No complications were encountered and no significant blood loss was encountered.  IMPRESSION: 1. Status post right lower extremity venogram after access of the popliteal vein. Initial images demonstrate femoral popliteal thrombus, compatible with DVT on prior ultrasound. 2. Status post pharmacologic, mechanical, and aspiration thrombectomy, there has been clearing of the right femoral popliteal venous thrombus with excellent flow through the fem-pop segment.  PLAN: Agree with continuing anti coagulation.  Follow-up clinic appointment with Dr. Loreta Ave or Dr. Lowella Dandy in 3-4 weeks with repeat duplex ultrasound.  Signed,  Yvone NeuJaime S. Loreta AveWagner, DO  [facility]  [facility]   Electronically Signed   By: Gilmer MorJaime  Wagner D.O.   On: 12/26/2013 16:54    Microbiology: No results found for this or any previous visit (from the past 240 hour(s)).   Labs: Basic Metabolic Panel:  Recent Labs Lab 12/22/13 2115 12/23/13 0516 12/26/13 0326  NA 136* 136* 133*  K 3.5* 3.6* 4.2  CL 99 101 100  CO2 24 21 21   GLUCOSE 100* 103* 89  BUN 13 11 12   CREATININE 0.87 0.81 0.82  CALCIUM 9.7 9.4 9.0  MG 2.0  --   --   PHOS 3.2  --   --    Liver Function Tests:  Recent Labs Lab 12/22/13 2115  AST 16  ALT 9  ALKPHOS 38*  BILITOT <0.2*  PROT 8.2  ALBUMIN 3.0*   No results found for this basename: LIPASE, AMYLASE,  in the last 168 hours No results found for this basename: AMMONIA,  in the last 168 hours CBC:  Recent Labs Lab 12/23/13 0516 12/24/13 0503 12/25/13 0628 12/26/13 0326 12/27/13 0636  WBC 7.7 5.7 5.4 6.4 5.8  HGB 12.8 12.7 12.6 12.6 12.5  HCT 38.5 38.0 37.2 37.3 37.6  MCV 96.7 95.5 94.2 95.2 97.9  PLT 424* 396 408* 346 334   Cardiac Enzymes: No results found for this basename: CKTOTAL, CKMB, CKMBINDEX, TROPONINI,  in the  last 168 hours BNP: BNP (last 3 results) No results found for this basename: PROBNP,  in the last 8760 hours CBG: No results found for this basename: GLUCAP,  in the last 168 hours     Signed:  [facility],Cyana Shook A  [facility] 12/27/2013, 12:28 PM

## 2013-12-28 LAB — PROTHROMBIN GENE MUTATION

## 2013-12-28 LAB — FACTOR 5 LEIDEN

## 2014-01-03 ENCOUNTER — Other Ambulatory Visit: Payer: Self-pay | Admitting: Interventional Radiology

## 2014-01-03 DIAGNOSIS — I82411 Acute embolism and thrombosis of right femoral vein: Secondary | ICD-10-CM

## 2014-01-03 DIAGNOSIS — S83251D Bucket-handle tear of lateral meniscus, current injury, right knee, subsequent encounter: Secondary | ICD-10-CM

## 2014-01-03 DIAGNOSIS — I82419 Acute embolism and thrombosis of unspecified femoral vein: Secondary | ICD-10-CM

## 2014-01-03 DIAGNOSIS — I82401 Acute embolism and thrombosis of unspecified deep veins of right lower extremity: Secondary | ICD-10-CM

## 2014-01-03 DIAGNOSIS — I2699 Other pulmonary embolism without acute cor pulmonale: Secondary | ICD-10-CM

## 2014-01-03 DIAGNOSIS — S83251A Bucket-handle tear of lateral meniscus, current injury, right knee, initial encounter: Secondary | ICD-10-CM

## 2014-01-08 HISTORY — PX: THROMBECTOMY: PRO61

## 2014-01-17 ENCOUNTER — Ambulatory Visit
Admission: RE | Admit: 2014-01-17 | Discharge: 2014-01-17 | Disposition: A | Discharge status: Home / Self Care | Source: Ambulatory Visit | Attending: Radiology | Admitting: Radiology

## 2014-01-17 ENCOUNTER — Other Ambulatory Visit (HOSPITAL_COMMUNITY): Payer: Self-pay | Admitting: Orthopedic Surgery

## 2014-01-17 ENCOUNTER — Ambulatory Visit
Admission: RE | Admit: 2014-01-17 | Discharge: 2014-01-17 | Disposition: A | Discharge status: Home / Self Care | Source: Ambulatory Visit | Attending: Interventional Radiology | Admitting: Interventional Radiology

## 2014-01-17 DIAGNOSIS — S83251A Bucket-handle tear of lateral meniscus, current injury, right knee, initial encounter: Secondary | ICD-10-CM

## 2014-01-17 DIAGNOSIS — I82419 Acute embolism and thrombosis of unspecified femoral vein: Secondary | ICD-10-CM

## 2014-01-17 DIAGNOSIS — S83251D Bucket-handle tear of lateral meniscus, current injury, right knee, subsequent encounter: Secondary | ICD-10-CM

## 2014-01-17 DIAGNOSIS — I82401 Acute embolism and thrombosis of unspecified deep veins of right lower extremity: Secondary | ICD-10-CM

## 2014-01-17 DIAGNOSIS — I82411 Acute embolism and thrombosis of right femoral vein: Secondary | ICD-10-CM

## 2014-01-17 DIAGNOSIS — I2699 Other pulmonary embolism without acute cor pulmonale: Secondary | ICD-10-CM

## 2014-01-17 NOTE — Progress Notes (Signed)
Chief Complaint: Chief Complaint  Patient presents with  . Follow-up    follow up DVT of RLE/PE      Referring Physician(s): Turpin,Pamela A  History of Present Illness: [name] is a 28 y.o. female who is status post right femoral vein and popliteal vein AngioJet thrombectomy, completed 12/26/2013. This was to treat her femoral popliteal DVT which was diagnosed on recent hospital admission.  [name] presented to the hospital 12/22/2013 with shortness of breath and right lower extremity DVT. The DVT is related to a right knee injury which he recently sustained before her admission. MRI showed a meniscus injury, significant swelling, as well as acute thrombus in the popliteal vein and distal femoral vein. Pulmonary embolism CT angiogram was performed which was positive for pulmonary emboli. The patient was initiated on Xarelto oral anticoagulation as therapy for PE and DVT.   I saw Ms [name] in the hospital as a potential candidate for right femoral popliteal thrombectomy. Given her young age and risk for developing future post thrombotic syndrome, it was my impression that she was an excellent candidate for thrombectomy. AngioJet thrombectomy was completed 12/26/2013 with excellent result, and clearing of the acute DVT involving the femoral and popliteal veins. Likely some residual thrombus was present below the level of access into the popliteal vein.  Today during the interview she states that her lower legs are symmetric with no significant swelling of the right calf. She does report some ongoing pain/swelling with her right knee, likely related to her musculoskeletal injury. She states that there is a surgical plan, for repair on the upcoming Tuesday, one week from today. At that time she tells me she will receive an IVC filter and transition from her oral anticoagulation. She tells me she will restart her anticoagulation at the request of her physicians on the day after her surgery.  I  had a lengthy discussion with the patient and her father regarding the natural history of DVT, the usual risk factors for DVT formation, and my impression for her risk into the future of developing further DVT and pulmonary embolism. I did tell her that her present DVT was most likely related to a musculoskeletal injury and venous stasis. My impression is that once she stops her anticoagulations 6 months from now, she will have a very low likelihood of developing another DVT. Her current residual thrombus that is evident on a contemporaneous duplex study of the right lower extremity (completed today) is expected given the location of her previous venous access for the thrombectomy, and has partially recanalized, showing partial healing.  I also shared with the patient that it's my impression that we have significantly decreased her risk of developing symptoms of post thrombotic syndrome, as the common femoral vein, greater saphenous vein, and majority of the femoral vein are patent after the thrombectomy.  No past medical history on file.  No past surgical history on file.  Allergies: Triaminic  Medications: Prior to Admission medications   Medication Sig Start Date End Date Taking? Authorizing Provider  oxyCODONE-acetaminophen (ROXICET) 5-325 MG per tablet Take 1 tablet by mouth every 4 (four) hours as needed for severe pain. 12/27/13  Yes Clydia Llano, MD  rivaroxaban (XARELTO) 20 MG TABS tablet Take 20 mg by mouth daily with supper.   Yes Historical Provider, MD    No family history on file.  History   Social History  . Marital Status: Single    Spouse Name: N/A    Number of  Children: N/A  . Years of Education: N/A   Social History Main Topics  . Smoking status: Never Smoker   . Smokeless tobacco: None  . Alcohol Use: Yes  . Drug Use: No  . Sexual Activity: None   Other Topics Concern  . None   Social History Narrative    ECOG Status: 0 - Asymptomatic  Review of Systems:  A 12 point ROS discussed and pertinent positives are indicated in the HPI above.  All other systems are negative.  Review of Systems  Vital Signs: BP 104/72 mmHg  Pulse 78  Temp(Src) 98.1 F (36.7 C) (Oral)  Resp 14  SpO2 100%  LMP 01/03/2014  Physical Exam  Imaging: Ir Veno/ext/uni Right  12/26/2013   CLINICAL DATA:  28 year old female with right meniscus injury. The patient has been immobile and has developed right femoral popliteal thrombus/DVT. She has also been using oral contraceptives, placing her at further risk.  She stands to benefit from thrombolysis given her risk of development of chronic symptoms of post thrombotic syndrome.  EXAM: RIGHT LOWER EXTREMITY VENOGRAM.  ULTRASOUND GUIDED ACCESS OF RIGHT POPLITEAL VEIN.  MECHANICAL AND PHARMACOLOGIC THROMBOLYSIS/THROMBECTOMY OF RIGHT FEMORAL POPLITEAL DVT.  FLUOROSCOPY TIME:  7 MIN, 30 SECONDS  MEDICATIONS: 3.0 mg Versed, 150 mcg fentanyl  ACCESS: Right popliteal vein  TECHNIQUE: A complete informed consent with all the risks and benefits was performed for the patient and the patient's family before initiating the procedure. The patient and the patient's family agreed to proceeding given their low risk of bleeding and potential benefit of the procedure.  The patient is placed in the prone position on the fluoroscopy table an the right light was prepped and draped in usual sterile fashion.  Ultrasound survey of the right posterior tibial vein was performed with a small vein identified. Ultrasound survey of the right popliteal region was then performed with images stored sent to PACs.  The skin and subcutaneous tissues overlying the right popliteal vein were generously infiltrated with 1% lidocaine for local anesthesia. A 21 gauge micropuncture needle was then used access the popliteal vein. With blood flow returned, a 0.018 micro wire was advanced into the popliteal vein. The needle was removed and a micropuncture sheath was placed over the  wire. Initial venogram was performed.  Serial exchange to an 0.035 Bentson wire was performed. We then exchanged for a Glidewire.  The 6 French sheath was then placed over the Glidewire. A 5 French catheter and the Glidewire were navigated into the iliac vein. We confirmed patency of the right iliac system and the IVC, and then placed a 0.035 Bentson wire into the femoral vein/iliac vein for ray all for Angiojet.  Pole spray technique was then used to infused 12 mg of tPA in 100 cc of normal saline under fluoroscopy.  We then used in 8 mm x 4 cm balloon for balloon maceration along the length of a clot. The thrombectomy function was then performed with the Angiojet on the wire, with a final angiogram performed.  Catheters wires and sheaths were all removed.  Patient tolerated the procedure well and remained hemodynamically stable throughout.  No complications were encountered and no significant blood loss was encountered.  IMPRESSION: 1. Status post right lower extremity venogram after access of the popliteal vein. Initial images demonstrate femoral popliteal thrombus, compatible with DVT on prior ultrasound. 2. Status post pharmacologic, mechanical, and aspiration thrombectomy, there has been clearing of the right femoral popliteal venous thrombus with excellent flow through the  fem-pop segment.  PLAN: Agree with continuing anti coagulation.  Follow-up clinic appointment with Dr. Loreta Ave or Dr. Lowella Dandy in 3-4 weeks with repeat duplex ultrasound.  Signed,  Yvone Neu. Loreta Ave, DO  [facility]  [facility]   Electronically Signed   By: Gilmer Mor D.O.   On: 12/26/2013 16:54   US Venous Img Lower Unilateral Right  01/17/2014   CLINICAL DATA:  28 year old female with a history of right lower extremity DVT, involving the right femoral vein and popliteal vein. She is status post Angiojet thrombectomy 12/26/2013. Known risk factors include immobility given her associated right  knee trauma.  EXAM: RIGHT LOWER EXTREMITY VENOUS DOPPLER ULTRASOUND  TECHNIQUE: Gray-scale sonography with graded compression, as well as color Doppler and duplex ultrasound were performed to evaluate the lower extremity deep venous systems from the level of the common femoral vein and including the common femoral, femoral, profunda femoral, popliteal and calf veins including the posterior tibial, peroneal and gastrocnemius veins when visible. The superficial great saphenous vein was also interrogated. Spectral Doppler was utilized to evaluate flow at rest and with distal augmentation maneuvers in the common femoral, femoral and popliteal veins.  COMPARISON:  None.  FINDINGS: Common Femoral Vein: No evidence of thrombus. Normal compressibility and flow on color Doppler imaging. Normal respiratory phasicity and augmentation.  Saphenofemoral Junction: No evidence of thrombus. Normal compressibility and flow on color Doppler imaging.  Profunda Femoral Vein: No evidence of thrombus. Normal compressibility and flow on color Doppler imaging.  Femoral Vein: The length of the femoral vein from the saphenous femoral junction to the distal femoral vein patent. Normal compressibility with normal flow. The very distal femoral vein with transition to the popliteal vein is noncompressible.  Popliteal Vein: Noncompressible with partially recannulized flow secondary to thrombus.  Calf Veins: Thrombus extends from the popliteal vein distally into the proximal posterior tibial veins.  Superficial Great Saphenous Vein: No evidence of thrombus. Normal compressibility and flow on color Doppler imaging.  Venous Reflux:  None.  Other Findings:  None.  IMPRESSION: Patent right common femoral vein, and right femoral vein, status post Angiojet thrombectomy.  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  Signed,  Yvone Neu. Loreta Ave, DO  [facility]  [facility]   Electronically Signed   By: Gilmer Mor D.O.   On: 01/17/2014 15:37   Ir Pta Venous Right  12/26/2013   CLINICAL DATA:  28 year old female with right meniscus injury. The patient has been immobile and has developed right femoral popliteal thrombus/DVT. She has also been using oral contraceptives, placing her at further risk.  She stands to benefit from thrombolysis given her risk of development of chronic symptoms of post thrombotic syndrome.  EXAM: RIGHT LOWER EXTREMITY VENOGRAM.  ULTRASOUND GUIDED ACCESS OF RIGHT POPLITEAL VEIN.  MECHANICAL AND PHARMACOLOGIC THROMBOLYSIS/THROMBECTOMY OF RIGHT FEMORAL POPLITEAL DVT.  FLUOROSCOPY TIME:  7 MIN, 30 SECONDS  MEDICATIONS: 3.0 mg Versed, 150 mcg fentanyl  ACCESS: Right popliteal vein  TECHNIQUE: A complete informed consent with all the risks and benefits was performed for the patient and the patient's family before initiating the procedure. The patient and the patient's family agreed to proceeding given their low risk of bleeding and potential benefit of the procedure.  The patient is placed in the prone position on the fluoroscopy table an the right light was prepped and draped in  usual sterile fashion.  Ultrasound survey of the right posterior tibial vein was performed with a small vein identified. Ultrasound survey of the right popliteal region was then performed with images stored sent to PACs.  The skin and subcutaneous tissues overlying the right popliteal vein were generously infiltrated with 1% lidocaine for local anesthesia. A 21 gauge micropuncture needle was then used access the popliteal vein. With blood flow returned, a 0.018 micro wire was advanced into the popliteal vein. The needle was removed and a micropuncture sheath was placed over the wire. Initial venogram was performed.  Serial exchange to an 0.035 Bentson wire was performed. We then exchanged for a  Glidewire.  The 6 French sheath was then placed over the Glidewire. A 5 French catheter and the Glidewire were navigated into the iliac vein. We confirmed patency of the right iliac system and the IVC, and then placed a 0.035 Bentson wire into the femoral vein/iliac vein for ray all for Angiojet.  Pole spray technique was then used to infused 12 mg of tPA in 100 cc of normal saline under fluoroscopy.  We then used in 8 mm x 4 cm balloon for balloon maceration along the length of a clot. The thrombectomy function was then performed with the Angiojet on the wire, with a final angiogram performed.  Catheters wires and sheaths were all removed.  Patient tolerated the procedure well and remained hemodynamically stable throughout.  No complications were encountered and no significant blood loss was encountered.  IMPRESSION: 1. Status post right lower extremity venogram after access of the popliteal vein. Initial images demonstrate femoral popliteal thrombus, compatible with DVT on prior ultrasound. 2. Status post pharmacologic, mechanical, and aspiration thrombectomy, there has been clearing of the right femoral popliteal venous thrombus with excellent flow through the fem-pop segment.  PLAN: Agree with continuing anti coagulation.  Follow-up clinic appointment with Dr. Loreta AveWagner or Dr. Lowella DandyHenn in 3-4 weeks with repeat duplex ultrasound.  Signed,  Yvone NeuJaime S. Loreta AveWagner, DO  [facility]  [facility]   Electronically Signed   By: Gilmer MorJaime  Mihaela Fajardo D.O.   On: 12/26/2013 16:54   Ir [place] Guide Vasc Access Right  12/28/2013   CLINICAL DATA:  28 year old with right lower extremity deep vein thrombosis. Scheduled for thrombolytics treatment.  EXAM: IR ULTRASOUND GUIDANCE VASC ACCESS RIGHT  Physician: Rachelle HoraAdam R. Lowella DandyHenn, MD  FLUOROSCOPY TIME:  None  MEDICATIONS: 2 mg Versed, 50 mcg fentanyl. A radiology nurse monitored the patient for moderate sedation.  ANESTHESIA/SEDATION: Moderate sedation time: 17  min  PROCEDURE: The procedure was explained to the patient. The risks and benefits of the procedure were discussed and the patient's questions were addressed. Informed consent was obtained from the patient. Patient was placed prone. The right popliteal region was prepped and draped in sterile fashion. Maximal barrier sterile technique was utilized including caps, mask, sterile gowns, sterile gloves, sterile drape, hand hygiene and skin antiseptic. Ultrasound demonstrated a thrombosed right popliteal vein. 1% lidocaine used for local anesthetic. 21 gauge needle directed into the popliteal vein and a wire was advanced. At this time, the procedure was canceled. Procedure was canceled because the patient was on Xarelto and did not want to start thrombolytics therapy while on this medication. Needle and wire removed. Bandage placed over the puncture site.  FINDINGS: Thrombosed right popliteal vein.  COMPLICATIONS: None  IMPRESSION: Ultrasound-guided access of the thrombosed right popliteal vein. Catheter-directed thrombolytic therapy was postponed.   Electronically Signed   By: Richarda OverlieAdam  Henn  M.D.   On: 12/28/2013 14:09   Ir US Guide Vasc Access Right  12/26/2013   CLINICAL DATA:  28 year old female with right meniscus injury. The patient has been immobile and has developed right femoral popliteal thrombus/DVT. She has also been using oral contraceptives, placing her at further risk.  She stands to benefit from thrombolysis given her risk of development of chronic symptoms of post thrombotic syndrome.  EXAM: RIGHT LOWER EXTREMITY VENOGRAM.  ULTRASOUND GUIDED ACCESS OF RIGHT POPLITEAL VEIN.  MECHANICAL AND PHARMACOLOGIC THROMBOLYSIS/THROMBECTOMY OF RIGHT FEMORAL POPLITEAL DVT.  FLUOROSCOPY TIME:  7 MIN, 30 SECONDS  MEDICATIONS: 3.0 mg Versed, 150 mcg fentanyl  ACCESS: Right popliteal vein  TECHNIQUE: A complete informed consent with all the risks and benefits was performed for the patient and the patient's family before  initiating the procedure. The patient and the patient's family agreed to proceeding given their low risk of bleeding and potential benefit of the procedure.  The patient is placed in the prone position on the fluoroscopy table an the right light was prepped and draped in usual sterile fashion.  Ultrasound survey of the right posterior tibial vein was performed with a small vein identified. Ultrasound survey of the right popliteal region was then performed with images stored sent to PACs.  The skin and subcutaneous tissues overlying the right popliteal vein were generously infiltrated with 1% lidocaine for local anesthesia. A 21 gauge micropuncture needle was then used access the popliteal vein. With blood flow returned, a 0.018 micro wire was advanced into the popliteal vein. The needle was removed and a micropuncture sheath was placed over the wire. Initial venogram was performed.  Serial exchange to an 0.035 Bentson wire was performed. We then exchanged for a Glidewire.  The 6 French sheath was then placed over the Glidewire. A 5 French catheter and the Glidewire were navigated into the iliac vein. We confirmed patency of the right iliac system and the IVC, and then placed a 0.035 Bentson wire into the femoral vein/iliac vein for ray all for Angiojet.  Pole spray technique was then used to infused 12 mg of tPA in 100 cc of normal saline under fluoroscopy.  We then used in 8 mm x 4 cm balloon for balloon maceration along the length of a clot. The thrombectomy function was then performed with the Angiojet on the wire, with a final angiogram performed.  Catheters wires and sheaths were all removed.  Patient tolerated the procedure well and remained hemodynamically stable throughout.  No complications were encountered and no significant blood loss was encountered.  IMPRESSION: 1. Status post right lower extremity venogram after access of the popliteal vein. Initial images demonstrate femoral popliteal thrombus,  compatible with DVT on prior ultrasound. 2. Status post pharmacologic, mechanical, and aspiration thrombectomy, there has been clearing of the right femoral popliteal venous thrombus with excellent flow through the fem-pop segment.  PLAN: Agree with continuing anti coagulation.  Follow-up clinic appointment with Dr. Loreta Ave or Dr. Lowella Dandy in 3-4 weeks with repeat duplex ultrasound.  Signed,  Yvone Neu. Loreta Ave, DO  [facility]  [facility]   Electronically Signed   By: Gilmer Mor D.O.   On: 12/26/2013 16:54   Ir Infusion Thrombol Venous Initial (ms)  12/26/2013   CLINICAL DATA:  27 year old female with right meniscus injury. The patient has been immobile and has developed right femoral popliteal thrombus/DVT. She has also been using oral contraceptives, placing her at further risk.  She stands to benefit from  thrombolysis given her risk of development of chronic symptoms of post thrombotic syndrome.  EXAM: RIGHT LOWER EXTREMITY VENOGRAM.  ULTRASOUND GUIDED ACCESS OF RIGHT POPLITEAL VEIN.  MECHANICAL AND PHARMACOLOGIC THROMBOLYSIS/THROMBECTOMY OF RIGHT FEMORAL POPLITEAL DVT.  FLUOROSCOPY TIME:  7 MIN, 30 SECONDS  MEDICATIONS: 3.0 mg Versed, 150 mcg fentanyl  ACCESS: Right popliteal vein  TECHNIQUE: A complete informed consent with all the risks and benefits was performed for the patient and the patient's family before initiating the procedure. The patient and the patient's family agreed to proceeding given their low risk of bleeding and potential benefit of the procedure.  The patient is placed in the prone position on the fluoroscopy table an the right light was prepped and draped in usual sterile fashion.  Ultrasound survey of the right posterior tibial vein was performed with a small vein identified. Ultrasound survey of the right popliteal region was then performed with images stored sent to PACs.  The skin and subcutaneous tissues overlying the right popliteal vein  were generously infiltrated with 1% lidocaine for local anesthesia. A 21 gauge micropuncture needle was then used access the popliteal vein. With blood flow returned, a 0.018 micro wire was advanced into the popliteal vein. The needle was removed and a micropuncture sheath was placed over the wire. Initial venogram was performed.  Serial exchange to an 0.035 Bentson wire was performed. We then exchanged for a Glidewire.  The 6 French sheath was then placed over the Glidewire. A 5 French catheter and the Glidewire were navigated into the iliac vein. We confirmed patency of the right iliac system and the IVC, and then placed a 0.035 Bentson wire into the femoral vein/iliac vein for ray all for Angiojet.  Pole spray technique was then used to infused 12 mg of tPA in 100 cc of normal saline under fluoroscopy.  We then used in 8 mm x 4 cm balloon for balloon maceration along the length of a clot. The thrombectomy function was then performed with the Angiojet on the wire, with a final angiogram performed.  Catheters wires and sheaths were all removed.  Patient tolerated the procedure well and remained hemodynamically stable throughout.  No complications were encountered and no significant blood loss was encountered.  IMPRESSION: 1. Status post right lower extremity venogram after access of the popliteal vein. Initial images demonstrate femoral popliteal thrombus, compatible with DVT on prior ultrasound. 2. Status post pharmacologic, mechanical, and aspiration thrombectomy, there has been clearing of the right femoral popliteal venous thrombus with excellent flow through the fem-pop segment.  PLAN: Agree with continuing anti coagulation.  Follow-up clinic appointment with Dr. Loreta AveWagner or Dr. Lowella DandyHenn in 3-4 weeks with repeat duplex ultrasound.  Signed,  Yvone NeuJaime S. Loreta AveWagner, DO  [facility]  [facility]   Electronically Signed   By: Gilmer MorJaime  Delance Weide D.O.   On: 12/26/2013 16:54     Labs:  CBC:  Recent Labs  12/24/13 0503 12/25/13 0628 12/26/13 0326 12/27/13 0636  WBC 5.7 5.4 6.4 5.8  HGB 12.7 12.6 12.6 12.5  HCT 38.0 37.2 37.3 37.6  PLT 396 408* 346 334    COAGS:  Recent Labs  12/24/13 0833 12/25/13 1130 12/25/13 1914 12/26/13 0326 12/27/13 0636  INR 1.39  --   --   --   --   APTT  --  65* 113* 109* 99*    BMP:  Recent Labs  12/22/13 2115 12/23/13 0516 12/26/13 0326  NA 136* 136* 133*  K 3.5* 3.6* 4.2  CL 99 101 100  CO2 24 21 21   GLUCOSE 100* 103* 89  BUN 13 11 12   CALCIUM 9.7 9.4 9.0  CREATININE 0.87 0.81 0.82  GFRNONAA >90 >90 >90  GFRAA >90 >90 >90    LIVER FUNCTION TESTS:  Recent Labs  12/22/13 2115  BILITOT <0.2*  AST 16  ALT 9  ALKPHOS 38*  PROT 8.2  ALBUMIN 3.0*    TUMOR MARKERS: No results for input(s): AFPTM, CEA, CA199, CHROMGRNA in the last 8760 hours.  Assessment and Plan:  28 year old female with right femoral popliteal DVT related to musculoskeletal injury and venous stasis, status post AngioJet thrombectomy 12/26/2013. A current DVT study on today's date shows patency of the common femoral vein, greater saphenous vein, and majority of the femoral vein.  My impression is that we have significantly decrease her risk of developing post thrombotic syndrome over the course of her life by treating the DVT. She should have a low risk of developing further/additional DVT.  Residual, partially recannalized DVT of the popliteal vein/distal femoral vein and posterior tibial veins, which is expected given the access site during thrombectomy.  Currently the patient has no symptoms attributable to the small segment residual DVT.  Nothing more to do from interventional radiology for this small segment.  Agree with continuation of oral anticoagulation, per primary physician.  Patient has upcoming surgery with orthopedics for repair of right meniscus, at which time she tells me the plan is for an IVC filter for caval  interruption.   We can see the patient on an as-needed basis.    I spent a total of 40 minutes face to face in clinical consultation, greater than 50% of which was counseling/coordinating care for follow-up of the patient's right femoral popliteal DVT, for which she was treated with AngioJet thrombectomy.  SignedMargaretha Sheffield 01/17/2014, 5:19 PM

## 2014-01-19 ENCOUNTER — Encounter (HOSPITAL_COMMUNITY)
Admission: RE | Admit: 2014-01-19 | Discharge: 2014-01-19 | Disposition: A | Discharge status: Home / Self Care | Source: Ambulatory Visit | Attending: Orthopedic Surgery | Admitting: Orthopedic Surgery

## 2014-01-19 ENCOUNTER — Encounter (HOSPITAL_COMMUNITY): Payer: Self-pay

## 2014-01-19 DIAGNOSIS — Z01812 Encounter for preprocedural laboratory examination: Secondary | ICD-10-CM | POA: Insufficient documentation

## 2014-01-19 HISTORY — DX: Acute embolism and thrombosis of right femoral vein: I82.411

## 2014-01-19 HISTORY — DX: Other pulmonary embolism without acute cor pulmonale: I26.99

## 2014-01-19 HISTORY — DX: Reserved for inherently not codable concepts without codable children: IMO0001

## 2014-01-19 LAB — HCG, SERUM, QUALITATIVE: Preg, Serum: NEGATIVE

## 2014-01-19 LAB — CBC
HEMATOCRIT: 37 % (ref 36.0–46.0)
Hemoglobin: 12.6 g/dL (ref 12.0–15.0)
MCH: 32.6 pg (ref 26.0–34.0)
MCHC: 34.1 g/dL (ref 30.0–36.0)
MCV: 95.6 fL (ref 78.0–100.0)
PLATELETS: 381 10*3/uL (ref 150–400)
RBC: 3.87 MIL/uL (ref 3.87–5.11)
RDW: 13.4 % (ref 11.5–15.5)
WBC: 7.2 10*3/uL (ref 4.0–10.5)

## 2014-01-19 NOTE — Pre-Procedure Instructions (Addendum)
Vanessa MonteJasmine B [name]  01/19/2014   Your procedure is scheduled on: Tuesday, November 17.  [facility] 12:40[facility].  Call this number if you have problems the morning of surgery: [phone]   Remember:   Do not eat food or drink liquids after midnight Monday, November 16.   Take these medicines the morning of surgery with A SIP OF WATER:  May take oxyCODONE-acetaminophen (ROXICET) if needed.              Stop Xarelto as instructed by your doctor.   Do not wear jewelry, make-up or nail polish.  Do not wear lotions, powders, or perfumes.   Do not shave 48 hours prior to surgery.   Do not bring valuables to the hospital.  [facility] is not responsible for any belongings or valuables.               Contacts, dentures or bridgework may not be worn into surgery.  Leave suitcase in the car. After surgery it may be brought to your room.  For patients admitted to the hospital, discharge time is determined by your treatment team.               Patients discharged the day of surgery will not be allowed to drive home.  Name and phone number of your driver: -   Special Instructions: Review  [place] - Preparing For Surgery.   Please read over the following fact sheets that you were given: Pain Booklet, Coughing and Deep Breathing and Surgical Site Infection Prevention

## 2014-01-19 NOTE — Progress Notes (Signed)
Ms [name] reports that she is to have IVP Filter Tuesday, in radiology she is to arrive at 10am, per the instructions patient has been given.  Surgery is schedul;ed for 1450.

## 2014-01-20 ENCOUNTER — Other Ambulatory Visit: Payer: Self-pay | Admitting: Radiology

## 2014-01-23 ENCOUNTER — Other Ambulatory Visit: Payer: Self-pay | Admitting: Radiology

## 2014-01-23 MED ORDER — CEFAZOLIN SODIUM-DEXTROSE 2-3 GM-% IV SOLR
2.0000 g | INTRAVENOUS | Status: DC
  Administered 2014-01-24: 2 g via INTRAVENOUS
  Filled 2014-01-23: qty 50

## 2014-01-24 ENCOUNTER — Ambulatory Visit (HOSPITAL_COMMUNITY)
Admission: RE | Admit: 2014-01-24 | Discharge: 2014-01-24 | Disposition: A | Discharge status: Home / Self Care | Source: Ambulatory Visit | Attending: Orthopedic Surgery | Admitting: Orthopedic Surgery

## 2014-01-24 ENCOUNTER — Ambulatory Visit (HOSPITAL_COMMUNITY): Admitting: Anesthesiology

## 2014-01-24 ENCOUNTER — Observation Stay (HOSPITAL_COMMUNITY)
Admission: RE | Admit: 2014-01-24 | Discharge: 2014-01-25 | Disposition: A | Discharge status: Home / Self Care | Source: Ambulatory Visit | Attending: Orthopedic Surgery | Admitting: Orthopedic Surgery

## 2014-01-24 ENCOUNTER — Encounter (HOSPITAL_COMMUNITY): Payer: Self-pay

## 2014-01-24 ENCOUNTER — Encounter (HOSPITAL_COMMUNITY)
Admission: RE | Disposition: A | Discharge status: Home / Self Care | Payer: Self-pay | Source: Ambulatory Visit | Attending: Orthopedic Surgery

## 2014-01-24 ENCOUNTER — Encounter (HOSPITAL_COMMUNITY): Payer: Self-pay | Admitting: Critical Care Medicine

## 2014-01-24 ENCOUNTER — Encounter (HOSPITAL_COMMUNITY): Payer: Self-pay | Admitting: *Deleted

## 2014-01-24 DIAGNOSIS — Z86711 Personal history of pulmonary embolism: Secondary | ICD-10-CM | POA: Diagnosis not present

## 2014-01-24 DIAGNOSIS — X58XXXA Exposure to other specified factors, initial encounter: Secondary | ICD-10-CM | POA: Diagnosis not present

## 2014-01-24 DIAGNOSIS — Y929 Unspecified place or not applicable: Secondary | ICD-10-CM | POA: Insufficient documentation

## 2014-01-24 DIAGNOSIS — Z86718 Personal history of other venous thrombosis and embolism: Secondary | ICD-10-CM | POA: Insufficient documentation

## 2014-01-24 DIAGNOSIS — Z7901 Long term (current) use of anticoagulants: Secondary | ICD-10-CM | POA: Insufficient documentation

## 2014-01-24 DIAGNOSIS — S83281A Other tear of lateral meniscus, current injury, right knee, initial encounter: Secondary | ICD-10-CM | POA: Diagnosis not present

## 2014-01-24 DIAGNOSIS — S83289A Other tear of lateral meniscus, current injury, unspecified knee, initial encounter: Secondary | ICD-10-CM | POA: Diagnosis present

## 2014-01-24 DIAGNOSIS — S83251D Bucket-handle tear of lateral meniscus, current injury, right knee, subsequent encounter: Secondary | ICD-10-CM

## 2014-01-24 HISTORY — PX: KNEE ARTHROSCOPY WITH MENISCAL REPAIR: SHX5653

## 2014-01-24 HISTORY — PX: KNEE ARTHROSCOPY W/ MENISCAL REPAIR: SHX1877

## 2014-01-24 LAB — BASIC METABOLIC PANEL
Anion gap: 14 (ref 5–15)
BUN: 14 mg/dL (ref 6–23)
CHLORIDE: 103 meq/L (ref 96–112)
CO2: 21 meq/L (ref 19–32)
Calcium: 9.5 mg/dL (ref 8.4–10.5)
Creatinine, Ser: 0.78 mg/dL (ref 0.50–1.10)
GFR calc Af Amer: >90 mL/min (ref >90)
GFR calc non Af Amer: >90 mL/min (ref >90)
Glucose, Bld: 78 mg/dL (ref 70–99)
Potassium: 4.1 mEq/L (ref 3.7–5.3)
SODIUM: 138 meq/L (ref 137–147)

## 2014-01-24 LAB — PROTIME-INR
INR: 0.98 (ref 0.00–1.49)
Prothrombin Time: 13.1 seconds (ref 11.6–15.2)

## 2014-01-24 SURGERY — ARTHROSCOPY, KNEE, WITH MENISCUS REPAIR
Anesthesia: General | Site: Knee | Laterality: Right

## 2014-01-24 MED ORDER — BUPIVACAINE HCL (PF) 0.25 % IJ SOLN
INTRAMUSCULAR | Status: DC | PRN
  Administered 2014-01-24: 10 mL

## 2014-01-24 MED ORDER — FENTANYL CITRATE 0.05 MG/ML IJ SOLN
INTRAMUSCULAR | Status: AC
  Filled 2014-01-24: qty 5

## 2014-01-24 MED ORDER — DEXAMETHASONE SODIUM PHOSPHATE 4 MG/ML IJ SOLN
INTRAMUSCULAR | Status: DC | PRN
  Administered 2014-01-24: 4 mg via INTRAVENOUS

## 2014-01-24 MED ORDER — PROPOFOL 10 MG/ML IV BOLUS
INTRAVENOUS | Status: DC | PRN
  Administered 2014-01-24: 200 mg via INTRAVENOUS

## 2014-01-24 MED ORDER — ACETAMINOPHEN 500 MG PO TABS: 500.0000 mg | ORAL_TABLET | Freq: Four times a day (QID) | ORAL | Status: DC | PRN

## 2014-01-24 MED ORDER — MIDAZOLAM HCL 2 MG/2ML IJ SOLN
INTRAMUSCULAR | Status: AC | PRN
  Administered 2014-01-24 (×2): 1 mg via INTRAVENOUS

## 2014-01-24 MED ORDER — POTASSIUM CHLORIDE IN NACL 20-0.9 MEQ/L-% IV SOLN
INTRAVENOUS | Status: DC
  Administered 2014-01-25: 01:00:00 via INTRAVENOUS
  Filled 2014-01-24 (×2): qty 1000

## 2014-01-24 MED ORDER — METHOCARBAMOL 1000 MG/10ML IJ SOLN
500.0000 mg | Freq: Four times a day (QID) | INTRAVENOUS | Status: DC | PRN
  Filled 2014-01-24: qty 5

## 2014-01-24 MED ORDER — ACETAMINOPHEN 325 MG PO TABS
ORAL_TABLET | ORAL | Status: AC
  Administered 2014-01-24: 650 mg via ORAL
  Filled 2014-01-24: qty 2

## 2014-01-24 MED ORDER — PROPOFOL 10 MG/ML IV BOLUS
INTRAVENOUS | Status: AC
  Filled 2014-01-24: qty 20

## 2014-01-24 MED ORDER — ONDANSETRON HCL 4 MG/2ML IJ SOLN
INTRAMUSCULAR | Status: DC | PRN
  Administered 2014-01-24: 4 mg via INTRAVENOUS

## 2014-01-24 MED ORDER — CHLORHEXIDINE GLUCONATE 4 % EX LIQD
60.0000 mL | Freq: Once | CUTANEOUS | Status: DC
  Filled 2014-01-24: qty 60

## 2014-01-24 MED ORDER — FENTANYL CITRATE 0.05 MG/ML IJ SOLN
INTRAMUSCULAR | Status: AC | PRN
  Administered 2014-01-24 (×4): 50 ug via INTRAVENOUS

## 2014-01-24 MED ORDER — HYDROMORPHONE HCL 1 MG/ML IJ SOLN
INTRAMUSCULAR | Status: AC
  Administered 2014-01-24: 0.5 mg via INTRAVENOUS
  Filled 2014-01-24: qty 1

## 2014-01-24 MED ORDER — ACETAMINOPHEN 160 MG/5ML PO SOLN
325.0000 mg | ORAL | Status: DC | PRN
  Filled 2014-01-24: qty 20.3

## 2014-01-24 MED ORDER — METOCLOPRAMIDE HCL 5 MG PO TABS
5.0000 mg | ORAL_TABLET | Freq: Three times a day (TID) | ORAL | Status: DC | PRN
  Filled 2014-01-24: qty 2

## 2014-01-24 MED ORDER — METHOCARBAMOL 500 MG PO TABS
500.0000 mg | ORAL_TABLET | Freq: Four times a day (QID) | ORAL | Status: DC | PRN
  Administered 2014-01-24 – 2014-01-25 (×4): 500 mg via ORAL
  Filled 2014-01-24 (×4): qty 1

## 2014-01-24 MED ORDER — METOCLOPRAMIDE HCL 5 MG/ML IJ SOLN: 5.0000 mg | Freq: Three times a day (TID) | INTRAMUSCULAR | Status: DC | PRN

## 2014-01-24 MED ORDER — MORPHINE SULFATE 4 MG/ML IJ SOLN
INTRAMUSCULAR | Status: AC
  Filled 2014-01-24: qty 2

## 2014-01-24 MED ORDER — LIDOCAINE HCL 1 % IJ SOLN
INTRAMUSCULAR | Status: AC
  Filled 2014-01-24: qty 20

## 2014-01-24 MED ORDER — CEFAZOLIN SODIUM-DEXTROSE 2-3 GM-% IV SOLR
2.0000 g | Freq: Four times a day (QID) | INTRAVENOUS | Status: AC
Start: 2014-01-24 — End: 2014-01-25
  Administered 2014-01-24 – 2014-01-25 (×3): 2 g via INTRAVENOUS
  Filled 2014-01-24 (×3): qty 50

## 2014-01-24 MED ORDER — SODIUM CHLORIDE 0.9 % IR SOLN
Status: DC | PRN
  Administered 2014-01-24: 2000 mL

## 2014-01-24 MED ORDER — BUPIVACAINE-EPINEPHRINE (PF) 0.5% -1:200000 IJ SOLN
INTRAMUSCULAR | Status: AC
  Filled 2014-01-24: qty 30

## 2014-01-24 MED ORDER — FENTANYL CITRATE 0.05 MG/ML IJ SOLN
INTRAMUSCULAR | Status: DC | PRN
  Administered 2014-01-24 (×3): 25 ug via INTRAVENOUS
  Administered 2014-01-24: 50 ug via INTRAVENOUS
  Administered 2014-01-24: 25 ug via INTRAVENOUS
  Administered 2014-01-24: 75 ug via INTRAVENOUS
  Administered 2014-01-24: 25 ug via INTRAVENOUS

## 2014-01-24 MED ORDER — MORPHINE SULFATE 4 MG/ML IJ SOLN
INTRAMUSCULAR | Status: DC | PRN
  Administered 2014-01-24: 4 mg via INTRAVENOUS

## 2014-01-24 MED ORDER — DEXAMETHASONE SODIUM PHOSPHATE 4 MG/ML IJ SOLN
INTRAMUSCULAR | Status: AC
  Filled 2014-01-24: qty 1

## 2014-01-24 MED ORDER — MIDAZOLAM HCL 2 MG/2ML IJ SOLN
INTRAMUSCULAR | Status: DC
Start: 2014-01-24 — End: 2014-01-24
  Filled 2014-01-24: qty 2

## 2014-01-24 MED ORDER — IOHEXOL 300 MG/ML  SOLN
100.0000 mL | Freq: Once | INTRAMUSCULAR | Status: AC | PRN
  Administered 2014-01-24: 40 mL via INTRAVENOUS

## 2014-01-24 MED ORDER — MIDAZOLAM HCL 2 MG/2ML IJ SOLN
INTRAMUSCULAR | Status: AC
  Filled 2014-01-24: qty 2

## 2014-01-24 MED ORDER — LIDOCAINE HCL (CARDIAC) 20 MG/ML IV SOLN
INTRAVENOUS | Status: AC
  Filled 2014-01-24: qty 5

## 2014-01-24 MED ORDER — OXYCODONE HCL 5 MG/5ML PO SOLN: 5.0000 mg | Freq: Once | ORAL | Status: DC | PRN

## 2014-01-24 MED ORDER — LIDOCAINE HCL (CARDIAC) 20 MG/ML IV SOLN
INTRAVENOUS | Status: DC | PRN
Start: 2014-01-24 — End: 2014-01-24
  Administered 2014-01-24: 70 mg via INTRAVENOUS

## 2014-01-24 MED ORDER — RIVAROXABAN 20 MG PO TABS
20.0000 mg | ORAL_TABLET | Freq: Every day | ORAL | Status: DC
  Administered 2014-01-24: 20 mg via ORAL
  Filled 2014-01-24 (×2): qty 1

## 2014-01-24 MED ORDER — GLYCOPYRROLATE 0.2 MG/ML IJ SOLN
INTRAMUSCULAR | Status: AC
  Filled 2014-01-24: qty 1

## 2014-01-24 MED ORDER — OXYCODONE-ACETAMINOPHEN 5-325 MG PO TABS
1.0000 | ORAL_TABLET | ORAL | Status: DC | PRN
  Administered 2014-01-24 – 2014-01-25 (×4): 1 via ORAL
  Filled 2014-01-24 (×3): qty 1

## 2014-01-24 MED ORDER — METHOCARBAMOL 500 MG PO TABS
ORAL_TABLET | ORAL | Status: AC
  Administered 2014-01-24: 500 mg via ORAL
  Filled 2014-01-24: qty 1

## 2014-01-24 MED ORDER — MIDAZOLAM HCL 5 MG/5ML IJ SOLN
INTRAMUSCULAR | Status: DC | PRN
  Administered 2014-01-24 (×2): 1 mg via INTRAVENOUS

## 2014-01-24 MED ORDER — SODIUM CHLORIDE 0.9 % IV SOLN
INTRAVENOUS | Status: DC
Start: 2014-01-24 — End: 2014-01-24

## 2014-01-24 MED ORDER — BUPIVACAINE HCL (PF) 0.25 % IJ SOLN
INTRAMUSCULAR | Status: AC
  Filled 2014-01-24: qty 30

## 2014-01-24 MED ORDER — FENTANYL CITRATE 0.05 MG/ML IJ SOLN
INTRAMUSCULAR | Status: AC
  Filled 2014-01-24: qty 2

## 2014-01-24 MED ORDER — OXYCODONE HCL 5 MG PO TABS
ORAL_TABLET | ORAL | Status: AC
  Filled 2014-01-24: qty 2

## 2014-01-24 MED ORDER — LACTATED RINGERS IV SOLN
INTRAVENOUS | Status: DC
  Administered 2014-01-24 (×2): via INTRAVENOUS

## 2014-01-24 MED ORDER — OXYCODONE HCL 5 MG PO TABS: 5.0000 mg | ORAL_TABLET | Freq: Once | ORAL | Status: DC | PRN

## 2014-01-24 MED ORDER — BUPIVACAINE-EPINEPHRINE 0.5% -1:200000 IJ SOLN
INTRAMUSCULAR | Status: DC | PRN
  Administered 2014-01-24: 10 mL

## 2014-01-24 MED ORDER — ACETAMINOPHEN 325 MG PO TABS
325.0000 mg | ORAL_TABLET | ORAL | Status: DC | PRN
  Administered 2014-01-24: 650 mg via ORAL

## 2014-01-24 MED ORDER — ONDANSETRON HCL 4 MG/2ML IJ SOLN
INTRAMUSCULAR | Status: AC
  Filled 2014-01-24: qty 2

## 2014-01-24 MED ORDER — ONDANSETRON HCL 4 MG/2ML IJ SOLN: 4.0000 mg | Freq: Four times a day (QID) | INTRAMUSCULAR | Status: DC | PRN

## 2014-01-24 MED ORDER — ONDANSETRON HCL 4 MG PO TABS: 4.0000 mg | ORAL_TABLET | Freq: Four times a day (QID) | ORAL | Status: DC | PRN

## 2014-01-24 MED ORDER — HYDROMORPHONE HCL 1 MG/ML IJ SOLN
0.2500 mg | INTRAMUSCULAR | Status: DC | PRN
  Administered 2014-01-24 (×4): 0.5 mg via INTRAVENOUS

## 2014-01-24 MED ORDER — CLONIDINE HCL (ANALGESIA) 100 MCG/ML EP SOLN
150.0000 ug | Freq: Once | EPIDURAL | Status: AC
  Administered 2014-01-24: 1 mL via INTRA_ARTICULAR
  Filled 2014-01-24: qty 1.5

## 2014-01-24 MED ORDER — OXYCODONE HCL 5 MG PO TABS
5.0000 mg | ORAL_TABLET | ORAL | Status: DC | PRN
  Administered 2014-01-25: 5 mg via ORAL
  Administered 2014-01-25 (×2): 10 mg via ORAL
  Filled 2014-01-24 (×2): qty 2
  Filled 2014-01-24: qty 1

## 2014-01-24 SURGICAL SUPPLY — 64 items
BANDAGE ELASTIC 6 VELCRO ST LF (GAUZE/BANDAGES/DRESSINGS) ×2 IMPLANT
BANDAGE ESMARK 6X9 LF (GAUZE/BANDAGES/DRESSINGS) IMPLANT
BLADE CUDA 5.5 (BLADE) IMPLANT
BLADE CUTTER GATOR 3.5 (BLADE) ×2 IMPLANT
BLADE GREAT WHITE 4.2 (BLADE) ×2 IMPLANT
BLADE SURG ROTATE 9660 (MISCELLANEOUS) ×2 IMPLANT
BNDG ESMARK 6X9 LF (GAUZE/BANDAGES/DRESSINGS)
BUR OVAL 6.0 (BURR) IMPLANT
CLSR STERI-STRIP ANTIMIC 1/2X4 (GAUZE/BANDAGES/DRESSINGS) ×2 IMPLANT
COVER MAYO STAND STRL (DRAPES) ×2 IMPLANT
COVER SURGICAL LIGHT HANDLE (MISCELLANEOUS) ×2 IMPLANT
CUFF TOURNIQUET SINGLE 34IN LL (TOURNIQUET CUFF) ×2 IMPLANT
DRAPE ARTHROSCOPY W/POUCH 114 (DRAPES) IMPLANT
DRAPE INCISE IOBAN 66X45 STRL (DRAPES) ×2 IMPLANT
DRAPE PROXIMA HALF (DRAPES) IMPLANT
DRAPE STERI 35X30 U-POUCH (DRAPES) ×2 IMPLANT
DRAPE U-SHAPE 47X51 STRL (DRAPES) ×2 IMPLANT
DURAPREP 26ML APPLICATOR (WOUND CARE) ×2 IMPLANT
ELECT REM PT RETURN 9FT ADLT (ELECTROSURGICAL) ×2
ELECTRODE REM PT RTRN 9FT ADLT (ELECTROSURGICAL) ×1 IMPLANT
GAUZE SPONGE 4X4 12PLY STRL (GAUZE/BANDAGES/DRESSINGS) IMPLANT
GAUZE XEROFORM 1X8 LF (GAUZE/BANDAGES/DRESSINGS) ×2 IMPLANT
GLOVE BIO SURGEON STRL SZ 6.5 (GLOVE) ×6 IMPLANT
GLOVE BIOGEL PI IND STRL 6.5 (GLOVE) ×3 IMPLANT
GLOVE BIOGEL PI IND STRL 8 (GLOVE) ×1 IMPLANT
GLOVE BIOGEL PI INDICATOR 6.5 (GLOVE) ×3
GLOVE BIOGEL PI INDICATOR 8 (GLOVE) ×1
GLOVE NEODERM STRL 7.5 LF PF (GLOVE) ×1 IMPLANT
GLOVE SURG NEODERM 7.5  LF PF (GLOVE) ×1
GLOVE SURG ORTHO 8.0 STRL STRW (GLOVE) ×4 IMPLANT
GLOVE SURG SYN 7.5  E (GLOVE) ×2
GLOVE SURG SYN 7.5 E (GLOVE) ×2 IMPLANT
GOWN STRL REIN XL XLG (GOWN DISPOSABLE) ×2 IMPLANT
GOWN STRL REUS W/ TWL LRG LVL3 (GOWN DISPOSABLE) ×2 IMPLANT
GOWN STRL REUS W/ TWL XL LVL3 (GOWN DISPOSABLE) ×1 IMPLANT
GOWN STRL REUS W/TWL LRG LVL3 (GOWN DISPOSABLE) ×2
GOWN STRL REUS W/TWL XL LVL3 (GOWN DISPOSABLE) ×1
KIT BASIN OR (CUSTOM PROCEDURE TRAY) ×2 IMPLANT
KIT ROOM TURNOVER OR (KITS) ×2 IMPLANT
MANIFOLD NEPTUNE II (INSTRUMENTS) IMPLANT
NEEDLE 18GX1X1/2 (RX/OR ONLY) (NEEDLE) IMPLANT
NEEDLE HYPO 25GX1X1/2 BEV (NEEDLE) ×2 IMPLANT
NS IRRIG 1000ML POUR BTL (IV SOLUTION) IMPLANT
PACK ARTHROSCOPY DSU (CUSTOM PROCEDURE TRAY) ×2 IMPLANT
PAD ARMBOARD 7.5X6 YLW CONV (MISCELLANEOUS) ×4 IMPLANT
PADDING CAST COTTON 6X4 STRL (CAST SUPPLIES) ×2 IMPLANT
PENCIL BUTTON HOLSTER BLD 10FT (ELECTRODE) ×2 IMPLANT
SET ARTHROSCOPY TUBING (MISCELLANEOUS) ×1
SET ARTHROSCOPY TUBING LN (MISCELLANEOUS) ×1 IMPLANT
SPONGE GAUZE 4X4 12PLY STER LF (GAUZE/BANDAGES/DRESSINGS) ×2 IMPLANT
SPONGE LAP 4X18 X RAY DECT (DISPOSABLE) ×2 IMPLANT
SUT ETHILON 3 0 PS 1 (SUTURE) IMPLANT
SUT FIBERWIRE 2-0 18 17.9 3/8 (SUTURE) ×14
SUT MENISCAL KIT (KITS) ×2 IMPLANT
SUT PROLENE 3 0 PS 1 (SUTURE) ×2 IMPLANT
SUTURE FIBERWR 2-0 18 17.9 3/8 (SUTURE) ×7 IMPLANT
SYR 20ML ECCENTRIC (SYRINGE) ×2 IMPLANT
SYR CONTROL 10ML LL (SYRINGE) ×2 IMPLANT
SYR TB 1ML LUER SLIP (SYRINGE) ×2 IMPLANT
TOWEL OR 17X24 6PK STRL BLUE (TOWEL DISPOSABLE) ×2 IMPLANT
TOWEL OR 17X26 10 PK STRL BLUE (TOWEL DISPOSABLE) ×2 IMPLANT
TUBE CONNECTING 12X1/4 (SUCTIONS) ×2 IMPLANT
WAND HAND CNTRL MULTIVAC 90 (MISCELLANEOUS) ×2 IMPLANT
WATER STERILE IRR 1000ML POUR (IV SOLUTION) ×2 IMPLANT

## 2014-01-24 NOTE — Transfer of Care (Signed)
Immediate Anesthesia Transfer of Care Note  Patient: [name]  Procedure(s) Performed: Procedure(s): RIGHT KNEE ARTHROSCOPY WITH LATERAL MENISCAL REPAIR (Right)  Patient Location: PACU  Anesthesia Type:General  Level of Consciousness: awake, alert  and oriented  Airway & Oxygen Therapy: Patient Spontanous Breathing and Patient connected to nasal cannula oxygen  Post-op Assessment: Report given to PACU RN  Post vital signs: Reviewed and stable  Complications: No apparent anesthesia complications

## 2014-01-24 NOTE — Sedation Documentation (Signed)
Patient denies pain and is resting comfortably.  

## 2014-01-24 NOTE — Anesthesia Preprocedure Evaluation (Addendum)
Anesthesia Evaluation  Patient identified by MRN, date of birth, ID band Patient awake    Reviewed: Allergy & Precautions, H&P , NPO status   History of Anesthesia Complications Negative for: history of anesthetic complications  Airway Mallampati: II  TM Distance: >3 FB Neck ROM: Full    Dental  (+) Dental Advisory Given, Teeth Intact   Pulmonary PE (PE 10/15) breath sounds clear to auscultation        Cardiovascular DVT Rhythm:Regular     Neuro/Psych negative neurological ROS  negative psych ROS   GI/Hepatic negative GI ROS, Neg liver ROS,   Endo/Other  negative endocrine ROS  Renal/GU negative Renal ROS     Musculoskeletal Right knee meniscus tear   Abdominal   Peds  Hematology   Anesthesia Other Findings anticoagulation for DVT/PE held and IVC filter placed preoperatively   Reproductive/Obstetrics                            Anesthesia Physical Anesthesia Plan  ASA: II  Anesthesia Plan: General   Post-op Pain Management:    Induction: Intravenous  Airway Management Planned: LMA  Additional Equipment: None  Intra-op Plan:   Post-operative Plan: Extubation in OR  Informed Consent: I have reviewed the patients History and Physical, chart, labs and discussed the procedure including the risks, benefits and alternatives for the proposed anesthesia with the patient or authorized representative who has indicated his/her understanding and acceptance.   Dental advisory given  Plan Discussed with: Surgeon and CRNA  Anesthesia Plan Comments:        Anesthesia Quick Evaluation

## 2014-01-24 NOTE — H&P (Signed)
Chief Complaint: "I am here for a filter placement."  Referring Physician(s): Dean,Gregory Scott  History of Present Illness: [name] is a 28 y.o. female with history of PE, DVT and scheduled today for right knee arthroscopy with inside-out meniscal repair. IR received request for IVC filter placement while off xarelto. She denies any chest pain, shortness of breath or palpitations. She states her right leg swelling is much improved. She denies any active signs of bleeding or excessive bruising. She denies any recent fever or chills. The patient denies any history of sleep apnea or chronic oxygen use. She has previously tolerated sedation without complications. She denies any chance of pregnancy.    Past Medical History  Diagnosis Date  . 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 PE (pulmonary embolism) 10/15  . Shortness of breath dyspnea     with PE     Past Surgical History  Procedure Laterality Date  . Wisdom tooth extraction      Allergies: Triaminic  Medications: Prior to Admission medications   Medication Sig Start Date End Date Taking? Authorizing Provider  acetaminophen (TYLENOL) 500 MG tablet Take 500 mg by mouth every 6 (six) hours as needed.    Historical Provider, MD  oxyCODONE-acetaminophen (ROXICET) 5-325 MG per tablet Take 1 tablet by mouth every 4 (four) hours as needed for severe pain. 12/27/13   Clydia Llano, MD  rivaroxaban (XARELTO) 20 MG TABS tablet Take 20 mg by mouth daily with supper.    Historical Provider, MD    History reviewed. No pertinent family history.  History   Social History  . Marital Status: Single    Spouse Name: N/A    Number of Children: N/A  . Years of Education: N/A   Social History Main Topics  . Smoking status: Never Smoker   . Smokeless tobacco: None  . Alcohol Use: No  . Drug Use: No  . Sexual Activity: None   Other Topics Concern  . None   Social History Narrative   Review of Systems: A 12 point  ROS discussed and pertinent positives are indicated in the HPI above.  All other systems are negative.  Review of Systems  Vital Signs: BP 120/74 mmHg  Pulse 76  Resp 22  SpO2 99%  LMP 01/03/2014  Physical Exam  Constitutional: She is oriented to person, place, and time. No distress.  HENT:  Head: Normocephalic and atraumatic.  Neck: No tracheal deviation present.  Cardiovascular: Normal rate and regular rhythm.  Exam reveals no gallop and no friction rub.   No murmur heard. Pulmonary/Chest: Effort normal and breath sounds normal. No respiratory distress. She has no wheezes. She has no rales.  Abdominal: Soft. Bowel sounds are normal. She exhibits no distension. There is no tenderness.  Musculoskeletal: She exhibits no edema or tenderness.  Neurological: She is alert and oriented to person, place, and time.  Skin: Skin is warm and dry. She is not diaphoretic.  Psychiatric: She has a normal mood and affect. Her behavior is normal. Thought content normal.   Imaging: Ir Veno/ext/uni Right  12/26/2013   CLINICAL DATA:  28 year old female with right meniscus injury. The patient has been immobile and has developed right femoral popliteal thrombus/DVT. She has also been using oral contraceptives, placing her at further risk.  She stands to benefit from thrombolysis given her risk of development of chronic symptoms of post thrombotic syndrome.  EXAM: RIGHT LOWER EXTREMITY VENOGRAM.  ULTRASOUND GUIDED  ACCESS OF RIGHT POPLITEAL VEIN.  MECHANICAL AND PHARMACOLOGIC THROMBOLYSIS/THROMBECTOMY OF RIGHT FEMORAL POPLITEAL DVT.  FLUOROSCOPY TIME:  7 MIN, 30 SECONDS  MEDICATIONS: 3.0 mg Versed, 150 mcg fentanyl  ACCESS: Right popliteal vein  TECHNIQUE: A complete informed consent with all the risks and benefits was performed for the patient and the patient's family before initiating the procedure. The patient and the patient's family agreed to proceeding given their low risk of bleeding and potential  benefit of the procedure.  The patient is placed in the prone position on the fluoroscopy table an the right light was prepped and draped in usual sterile fashion.  Ultrasound survey of the right posterior tibial vein was performed with a small vein identified. Ultrasound survey of the right popliteal region was then performed with images stored sent to PACs.  The skin and subcutaneous tissues overlying the right popliteal vein were generously infiltrated with 1% lidocaine for local anesthesia. A 21 gauge micropuncture needle was then used access the popliteal vein. With blood flow returned, a 0.018 micro wire was advanced into the popliteal vein. The needle was removed and a micropuncture sheath was placed over the wire. Initial venogram was performed.  Serial exchange to an 0.035 Bentson wire was performed. We then exchanged for a Glidewire.  The 6 French sheath was then placed over the Glidewire. A 5 French catheter and the Glidewire were navigated into the iliac vein. We confirmed patency of the right iliac system and the IVC, and then placed a 0.035 Bentson wire into the femoral vein/iliac vein for ray all for Angiojet.  Pole spray technique was then used to infused 12 mg of tPA in 100 cc of normal saline under fluoroscopy.  We then used in 8 mm x 4 cm balloon for balloon maceration along the length of a clot. The thrombectomy function was then performed with the Angiojet on the wire, with a final angiogram performed.  Catheters wires and sheaths were all removed.  Patient tolerated the procedure well and remained hemodynamically stable throughout.  No complications were encountered and no significant blood loss was encountered.  IMPRESSION: 1. Status post right lower extremity venogram after access of the popliteal vein. Initial images demonstrate femoral popliteal thrombus, compatible with DVT on prior ultrasound. 2. Status post pharmacologic, mechanical, and aspiration thrombectomy, there has been clearing  of the right femoral popliteal venous thrombus with excellent flow through the fem-pop segment.  PLAN: Agree with continuing anti coagulation.  Follow-up clinic appointment with Dr. Loreta Ave or Dr. Lowella Dandy in 3-4 weeks with repeat duplex ultrasound.  Signed,  Yvone Neu. Loreta Ave, DO  [facility]  [facility]   Electronically Signed   By: Gilmer Mor D.O.   On: 12/26/2013 16:54   US Venous Img Lower Unilateral Right  01/17/2014   CLINICAL DATA:  28 year old female with a history of right lower extremity DVT, involving the right femoral vein and popliteal vein. She is status post Angiojet thrombectomy 12/26/2013. Known risk factors include immobility given her associated right knee trauma.  EXAM: RIGHT LOWER EXTREMITY VENOUS DOPPLER ULTRASOUND  TECHNIQUE: Gray-scale sonography with graded compression, as well as color Doppler and duplex ultrasound were performed to evaluate the lower extremity deep venous systems from the level of the common femoral vein and including the common femoral, femoral, profunda femoral, popliteal and calf veins including the posterior tibial, peroneal and gastrocnemius veins when visible. The superficial great saphenous vein was also interrogated. Spectral Doppler was utilized to evaluate flow at  rest and with distal augmentation maneuvers in the common femoral, femoral and popliteal veins.  COMPARISON:  None.  FINDINGS: Common Femoral Vein: No evidence of thrombus. Normal compressibility and flow on color Doppler imaging. Normal respiratory phasicity and augmentation.  Saphenofemoral Junction: No evidence of thrombus. Normal compressibility and flow on color Doppler imaging.  Profunda Femoral Vein: No evidence of thrombus. Normal compressibility and flow on color Doppler imaging.  Femoral Vein: The length of the femoral vein from the saphenous femoral junction to the distal femoral vein patent. Normal compressibility with normal flow. The very  distal femoral vein with transition to the popliteal vein is noncompressible.  Popliteal Vein: Noncompressible with partially recannulized flow secondary to thrombus.  Calf Veins: Thrombus extends from the popliteal vein distally into the proximal posterior tibial veins.  Superficial Great Saphenous Vein: No evidence of thrombus. Normal compressibility and flow on color Doppler imaging.  Venous Reflux:  None.  Other Findings:  None.  IMPRESSION: Patent right common femoral vein, and right femoral vein, status post Angiojet thrombectomy.  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  Signed,  Yvone Neu. Loreta Ave, DO  [facility]  [facility]   Electronically Signed   By: Gilmer Mor D.O.   On: 01/17/2014 15:37   Ir Pta Venous Right  12/26/2013   CLINICAL DATA:  28 year old female with right meniscus injury. The patient has been immobile and has developed right femoral popliteal thrombus/DVT. She has also been using oral contraceptives, placing her at further risk.  She stands to benefit from thrombolysis given her risk of development of chronic symptoms of post thrombotic syndrome.  EXAM: RIGHT LOWER EXTREMITY VENOGRAM.  ULTRASOUND GUIDED ACCESS OF RIGHT POPLITEAL VEIN.  MECHANICAL AND PHARMACOLOGIC THROMBOLYSIS/THROMBECTOMY OF RIGHT FEMORAL POPLITEAL DVT.  FLUOROSCOPY TIME:  7 MIN, 30 SECONDS  MEDICATIONS: 3.0 mg Versed, 150 mcg fentanyl  ACCESS: Right popliteal vein  TECHNIQUE: A complete informed consent with all the risks and benefits was performed for the patient and the patient's family before initiating the procedure. The patient and the patient's family agreed to proceeding given their low risk of bleeding and potential benefit of the procedure.  The patient is placed in the prone position on the fluoroscopy table an the right light was  prepped and draped in usual sterile fashion.  Ultrasound survey of the right posterior tibial vein was performed with a small vein identified. Ultrasound survey of the right popliteal region was then performed with images stored sent to PACs.  The skin and subcutaneous tissues overlying the right popliteal vein were generously infiltrated with 1% lidocaine for local anesthesia. A 21 gauge micropuncture needle was then used access the popliteal vein. With blood flow returned, a 0.018 micro wire was advanced into the popliteal vein. The needle was removed and a micropuncture sheath was placed over the wire. Initial venogram was performed.  Serial exchange to an 0.035 Bentson wire was performed. We then exchanged for a Glidewire.  The 6 French sheath was then placed over the Glidewire. A 5 French catheter and the Glidewire were navigated into the iliac vein. We confirmed patency of the right iliac system and the IVC, and then placed a 0.035 Bentson wire into the femoral vein/iliac vein for ray all for Angiojet.  Pole spray technique was then used to infused 12 mg of tPA in 100 cc of normal saline under fluoroscopy.  We then used in 8 mm x 4 cm balloon for balloon maceration along the length of a clot. The thrombectomy function was then performed with the Angiojet on the wire, with a final angiogram performed.  Catheters wires and sheaths were all removed.  Patient tolerated the procedure well and remained hemodynamically stable throughout.  No complications were encountered and no significant blood loss was encountered.  IMPRESSION: 1. Status post right lower extremity venogram after access of the popliteal vein. Initial images demonstrate femoral popliteal thrombus, compatible with DVT on prior ultrasound. 2. Status post pharmacologic, mechanical, and aspiration thrombectomy, there has been clearing of the right femoral popliteal venous thrombus with excellent flow through the fem-pop segment.  PLAN: Agree with  continuing anti coagulation.  Follow-up clinic appointment with Dr. Loreta AveWagner or Dr. Lowella DandyHenn in 3-4 weeks with repeat duplex ultrasound.  Signed,  Yvone NeuJaime S. Loreta AveWagner, DO  [facility]  [facility]   Electronically Signed   By: Gilmer MorJaime  Wagner D.O.   On: 12/26/2013 16:54   Ir [place]as Guide Vasc Access Right  12/26/2013   CLINICAL DATA:  28 year old female with right meniscus injury. The patient has been immobile and has developed right femoral popliteal thrombus/DVT. She has also been using oral contraceptives, placing her at further risk.  She stands to benefit from thrombolysis given her risk of development of chronic symptoms of post thrombotic syndrome.  EXAM: RIGHT LOWER EXTREMITY VENOGRAM.  ULTRASOUND GUIDED ACCESS OF RIGHT POPLITEAL VEIN.  MECHANICAL AND PHARMACOLOGIC THROMBOLYSIS/THROMBECTOMY OF RIGHT FEMORAL POPLITEAL DVT.  FLUOROSCOPY TIME:  7 MIN, 30 SECONDS  MEDICATIONS: 3.0 mg Versed, 150 mcg fentanyl  ACCESS: Right popliteal vein  TECHNIQUE: A complete informed consent with all the risks and benefits was performed for the patient and the patient's family before initiating the procedure. The patient and the patient's family agreed to proceeding given their low risk of bleeding and potential benefit of the procedure.  The patient is placed in the prone position on the fluoroscopy table an the right light was prepped and draped in usual sterile fashion.  Ultrasound survey of the right posterior tibial vein was performed with a small vein identified. Ultrasound survey of the right popliteal region was then performed with images stored sent to PACs.  The skin and subcutaneous tissues overlying the right popliteal vein were generously infiltrated with 1% lidocaine for local anesthesia. A 21 gauge micropuncture needle was then used access the popliteal vein. With blood flow returned, a 0.018 micro wire was advanced into the popliteal vein. The needle was removed and a  micropuncture sheath was placed over the wire. Initial venogram was performed.  Serial exchange to an 0.035 Bentson wire was performed. We then exchanged for a Glidewire.  The 6 French sheath was then placed over the Glidewire. A 5 French catheter and the Glidewire were navigated into the iliac vein. We confirmed patency of the right iliac system and the IVC, and then placed a 0.035 Bentson wire into the femoral vein/iliac vein for ray all for Angiojet.  Pole spray technique was then used to infused 12 mg of tPA in 100 cc of normal saline under fluoroscopy.  We then used in 8 mm x 4 cm balloon for balloon maceration along the length of a clot. The thrombectomy function was then performed with the Angiojet on the wire, with a final angiogram performed.  Catheters wires and sheaths were all removed.  Patient tolerated the procedure well and remained hemodynamically stable throughout.  No  complications were encountered and no significant blood loss was encountered.  IMPRESSION: 1. Status post right lower extremity venogram after access of the popliteal vein. Initial images demonstrate femoral popliteal thrombus, compatible with DVT on prior ultrasound. 2. Status post pharmacologic, mechanical, and aspiration thrombectomy, there has been clearing of the right femoral popliteal venous thrombus with excellent flow through the fem-pop segment.  PLAN: Agree with continuing anti coagulation.  Follow-up clinic appointment with Dr. Loreta AveWagner or Dr. Lowella DandyHenn in 3-4 weeks with repeat duplex ultrasound.  Signed,  Yvone NeuJaime S. Loreta AveWagner, DO  [facility]  [facility]   Electronically Signed   By: Gilmer MorJaime  Wagner D.O.   On: 12/26/2013 16:54   Ir Infusion Thrombol Venous Initial (ms)  12/26/2013   CLINICAL DATA:  28 year old female with right meniscus injury. The patient has been immobile and has developed right femoral popliteal thrombus/DVT. She has also been using oral contraceptives, placing her  at further risk.  She stands to benefit from thrombolysis given her risk of development of chronic symptoms of post thrombotic syndrome.  EXAM: RIGHT LOWER EXTREMITY VENOGRAM.  ULTRASOUND GUIDED ACCESS OF RIGHT POPLITEAL VEIN.  MECHANICAL AND PHARMACOLOGIC THROMBOLYSIS/THROMBECTOMY OF RIGHT FEMORAL POPLITEAL DVT.  FLUOROSCOPY TIME:  7 MIN, 30 SECONDS  MEDICATIONS: 3.0 mg Versed, 150 mcg fentanyl  ACCESS: Right popliteal vein  TECHNIQUE: A complete informed consent with all the risks and benefits was performed for the patient and the patient's family before initiating the procedure. The patient and the patient's family agreed to proceeding given their low risk of bleeding and potential benefit of the procedure.  The patient is placed in the prone position on the fluoroscopy table an the right light was prepped and draped in usual sterile fashion.  Ultrasound survey of the right posterior tibial vein was performed with a small vein identified. Ultrasound survey of the right popliteal region was then performed with images stored sent to PACs.  The skin and subcutaneous tissues overlying the right popliteal vein were generously infiltrated with 1% lidocaine for local anesthesia. A 21 gauge micropuncture needle was then used access the popliteal vein. With blood flow returned, a 0.018 micro wire was advanced into the popliteal vein. The needle was removed and a micropuncture sheath was placed over the wire. Initial venogram was performed.  Serial exchange to an 0.035 Bentson wire was performed. We then exchanged for a Glidewire.  The 6 French sheath was then placed over the Glidewire. A 5 French catheter and the Glidewire were navigated into the iliac vein. We confirmed patency of the right iliac system and the IVC, and then placed a 0.035 Bentson wire into the femoral vein/iliac vein for ray all for Angiojet.  Pole spray technique was then used to infused 12 mg of tPA in 100 cc of normal saline under fluoroscopy.  We  then used in 8 mm x 4 cm balloon for balloon maceration along the length of a clot. The thrombectomy function was then performed with the Angiojet on the wire, with a final angiogram performed.  Catheters wires and sheaths were all removed.  Patient tolerated the procedure well and remained hemodynamically stable throughout.  No complications were encountered and no significant blood loss was encountered.  IMPRESSION: 1. Status post right lower extremity venogram after access of the popliteal vein. Initial images demonstrate femoral popliteal thrombus, compatible with DVT on prior ultrasound. 2. Status post pharmacologic, mechanical, and aspiration thrombectomy, there has been clearing of the right femoral popliteal venous thrombus with  excellent flow through the fem-pop segment.  PLAN: Agree with continuing anti coagulation.  Follow-up clinic appointment with Dr. Loreta Ave or Dr. Lowella Dandy in 3-4 weeks with repeat duplex ultrasound.  Signed,  Yvone Neu. Loreta Ave, DO  [facility]  [facility]   Electronically Signed   By: Gilmer Mor D.O.   On: 12/26/2013 16:54    Labs:  CBC:  Recent Labs  12/25/13 0628 12/26/13 0326 12/27/13 0636 01/19/14 1547  WBC 5.4 6.4 5.8 7.2  HGB 12.6 12.6 12.5 12.6  HCT 37.2 37.3 37.6 37.0  PLT 408* 346 334 381    COAGS:  Recent Labs  12/24/13 0833 12/25/13 1130 12/25/13 1914 12/26/13 0326 12/27/13 0636 01/24/14 1100  INR 1.39  --   --   --   --  0.98  APTT  --  65* 113* 109* 99*  --     BMP:  Recent Labs  12/22/13 2115 12/23/13 0516 12/26/13 0326 01/24/14 1100  NA 136* 136* 133* 138  K 3.5* 3.6* 4.2 4.1  CL 99 101 100 103  CO2 24 21 21 21   GLUCOSE 100* 103* 89 78  BUN 13 11 12 14   CALCIUM 9.7 9.4 9.0 9.5  CREATININE 0.87 0.81 0.82 0.78  GFRNONAA >90 >90 >90 >90  GFRAA >90 >90 >90 >90    LIVER FUNCTION TESTS:  Recent Labs  12/22/13 2115  BILITOT <0.2*  AST 16  ALT 9  ALKPHOS 38*  PROT 8.2    ALBUMIN 3.0*   Assessment and Plan: History of PE History of DVT on xarelto-being held for right knee arthroscopy with inside-out meniscal repair later today. Scheduled today for image guided retrievable IVC filter placement with minimal sedation Patient has been NPO, labs reviewed, denies pregnancy.  Risks and Benefits discussed with the patient. All of the patient's questions were answered, patient is agreeable to proceed. Consent signed and in chart.   SignedBerneta Levins 01/24/2014, 12:14 PM

## 2014-01-24 NOTE — Brief Op Note (Signed)
01/24/2014  5:27 PM  PATIENT:  [name]  28 y.o. female  PRE-OPERATIVE DIAGNOSIS:  right knee lateral meniscal tear  POST-OPERATIVE DIAGNOSIS:  right knee lateral meniscal tear  PROCEDURE:  Procedure(s): RIGHT KNEE ARTHROSCOPY WITH LATERAL MENISCAL REPAIR  SURGEON:  Surgeon(s): Cammy CopaGregory Scott Twinkle Sockwell, MD Coralyn MarkNaiping Glee ArvinMichael Xu, MD  ASSISTANT: xu md  ANESTHESIA:   general  EBL: 25 ml    Total I/O In: 1000 [I.V.:1000] Out: -   BLOOD ADMINISTERED: none  DRAINS: none   LOCAL MEDICATIONS USED:  Marcaine mso4 clonidine SPECIMEN:  No Specimen  COUNTS:  YES  TOURNIQUET:    DICTATION: .Other Dictation: Dictation Number [id number]  PLAN OF CARE: Admit for overnight observation  PATIENT DISPOSITION:  PACU - hemodynamically stable

## 2014-01-24 NOTE — Anesthesia Procedure Notes (Signed)
Procedure Name: LMA Insertion Date/Time: 01/24/2014 2:37 PM Performed by: Elon AlasLEE, Lindamarie Maclachlan BROWN Pre-anesthesia Checklist: Patient identified, Timeout performed, Emergency Drugs available, Suction available and Patient being monitored Patient Re-evaluated:Patient Re-evaluated prior to inductionOxygen Delivery Method: Circle system utilized Preoxygenation: Pre-oxygenation with 100% oxygen Intubation Type: IV induction Ventilation: Mask ventilation without difficulty LMA: LMA inserted LMA Size: 4.0 Number of attempts: 1 Placement Confirmation: CO2 detector,  positive ETCO2 and breath sounds checked- equal and bilateral Tube secured with: Tape Dental Injury: Teeth and Oropharynx as per pre-operative assessment

## 2014-01-24 NOTE — Progress Notes (Signed)
ANTICOAGULATION CONSULT NOTE - Initial Consult  Pharmacy Consult for Xarelto Indication: hx DVT + PE in October 2015  Allergies  Allergen Reactions  . Triaminic Anaphylaxis and Hives    Patient Measurements: Height: 5\' 6"  (167.6 cm) Weight: 185 lb 13.6 oz (84.301 kg) IBW/kg (Calculated) : 59.3  Labs:  Recent Labs  01/24/14 1100  LABPROT 13.1  INR 0.98  CREATININE 0.78    Estimated Creatinine Clearance: 116.6 mL/min (by C-G formula based on Cr of 0.78).   Medical History: Past Medical History  Diagnosis Date  . 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 PE (pulmonary embolism) 10/15  . Shortness of breath dyspnea     with PE    Assessment:   Hx DVT and PE in October 2015 after right knee injury and while on OCPs.  Required thrombolysis/thrombectomy via AngioJet on 12/26/13. Had Xarelto 15 mg BID x 21 days, then 20 mg daily with supper.  Last dose at home on 11/14, for arthroscopic meniscus repair today.  EBL ~25 ml.   IVC filter placed on 01/23/14.  Dr. August Saucerean spoke with another pharmacist earlier tonight. To resume Xarelto ~ 6hrs post-op.  No need for IV heparin overlap.  Goal of Therapy:  full-anticoagulation with Xarelto Monitor platelets by anticoagulation protocol: Yes   Plan:   Xarelto 20 mg to resume at ~10pm tonight.  Patient is expecting discharge in am.  Xarelto 20 mg daily with supper to resume on 01/25/14.  Dennie Fettersgan, Jeriann Sayres Donovan, [place] Pager: [phone] 01/24/2014,8:17 PM

## 2014-01-24 NOTE — Progress Notes (Signed)
Discussed with pharmacy Will restart xarelto tonight 20 mg po qd at 10 pm tonight No need for heparin additionally

## 2014-01-24 NOTE — Procedures (Signed)
Post-Procedure Note  Pre-operative Diagnosis: Scheduled for knee surgery with history of DVT and PE       Post-operative Diagnosis: same   Indications: PE prophylaxis  Procedure Details:   Started procedure in left groin.  Unusual femoral vascular anatomy.  Attempted to puncture left CFV with US but there was arterial flow.  Variant arterial anatomy in this location and therefore did not advance wire or place catheter in left groin.  Manual compression applied to left groin and no evidence for bleeding in this area after filter placement.  Venous access achieved in right IJ.  IVC venogram performed.  The right renal vein was identified but difficult to identify left renal vein.  Review of prior abdominal CT demonstrated the left renal vein above the right.  Therefore, Denali (retrievable filter) filter placed below the right renal vein without complication.   Findings: Patent IVC.    Complications: None     Condition: Stable  Plan: Return to Short Stay and plan for knee schedule today.

## 2014-01-24 NOTE — OR Nursing (Signed)
Prior to draping for procedure, puncture site in left groin was assessed and the dressing was dry.  Right neck dressing was also assessed and found to be clean and dry.    Patient had on her personal compression hose on left leg and SCD hose was applied and started when patient entered the OR.

## 2014-01-24 NOTE — Sedation Documentation (Signed)
Attempts were made to go through the Lt groin, which were unsussessful. Gauze and Tegaderm placed to the site, appears C/D/I. Procedure was completed through the jugular with successful placement of filter. To the site is gauze and tegaderm.

## 2014-01-24 NOTE — H&P (Signed)
[name] is an 28 y.o. female.   Chief Complaint:right knee pain  HPI: [name] is a 28 year old female with right knee pain she had a knee injury over a month ago. Her course is been complicated by deep vein thrombosis and pulmonary embolism. MRI scan shows bucket-handle tear of the lateral meniscus displaced into the anterior portion of the joint. She presents now for operative management of her knee. Patient did have a Greenfield filter placed yesterday.  Past Medical History  Diagnosis Date  . 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 PE (pulmonary embolism) 10/15  . Shortness of breath dyspnea     with PE     Past Surgical History  Procedure Laterality Date  . Wisdom tooth extraction      No family history on file. Social History:  reports that she has never smoked. She does not have any smokeless tobacco history on file. She reports that she does not drink alcohol or use illicit drugs.  Allergies:  Allergies  Allergen Reactions  . Triaminic Anaphylaxis and Hives    Medications Prior to Admission  Medication Sig Dispense Refill  . oxyCODONE-acetaminophen (ROXICET) 5-325 MG per tablet Take 1 tablet by mouth every 4 (four) hours as needed for severe pain. 30 tablet 0  . rivaroxaban (XARELTO) 20 MG TABS tablet Take 20 mg by mouth daily with supper.    Marland Kitchen. acetaminophen (TYLENOL) 500 MG tablet Take 500 mg by mouth every 6 (six) hours as needed.      No results found for this or any previous visit (from the past 48 hour(s)). No results found.  Review of Systems  Constitutional: Negative.   HENT: Negative.   Eyes: Negative.   Respiratory: Negative.   Cardiovascular: Negative.   Gastrointestinal: Negative.   Genitourinary: Negative.   Musculoskeletal: Positive for joint pain.  Skin: Negative.   Neurological: Negative.   Endo/Heme/Allergies: Negative.   Psychiatric/Behavioral: Negative.     There were no vitals taken for this visit. Physical Exam   Constitutional: She appears well-developed.  HENT:  Head: Normocephalic.  Eyes: Pupils are equal, round, and reactive to light.  Neck: Normal range of motion.  Cardiovascular: Normal rate.   Respiratory: Effort normal.  Neurological: She is alert.  Skin: Skin is warm.  Psychiatric: She has a normal mood and affect.   examination of the right knee demonstrates lateral joint line tenderness intact extensor mechanism palpable pedal pulses no calf or thigh tenderness she has loss of full extension by 10 dorsiflexion plantarflexion of the foot is intact  Assessment/Plan Impression is right knee bucket-handle tear of the lateral meniscus involving essentially the entire meniscus plan arthroscopy with inside-out meniscal repair risk and benefits discussed with the patient including not limited to infection nerve vessel damage potential for incomplete healing of the repair which would require more surgery patient extends the risk benefits and wished to proceed all questions answered  Armaan Pond SCOTT 01/24/2014, 10:30 AM

## 2014-01-24 NOTE — Progress Notes (Signed)
Orthopedic Tech Progress Note Patient Details:  Verlee MonteJasmine B Splitt 07/30/1985 [id number] Applied OHF with trapeze to pt.'s bed.  Delivered CPM to pt.'s room for use in AM. Patient ID: Verlee MonteJasmine B Andreen, female   DOB: 12/24/1985, 28 y.o.   MRN: [id number]   Lesle ChrisGilliland, Oz Gammel L 01/24/2014, 7:50 PM

## 2014-01-25 ENCOUNTER — Encounter (HOSPITAL_COMMUNITY): Payer: Self-pay | Admitting: General Practice

## 2014-01-25 DIAGNOSIS — S83281A Other tear of lateral meniscus, current injury, right knee, initial encounter: Secondary | ICD-10-CM | POA: Diagnosis not present

## 2014-01-25 MED ORDER — METHOCARBAMOL 500 MG PO TABS: 500.0000 mg | ORAL_TABLET | Freq: Four times a day (QID) | ORAL | Status: AC | PRN

## 2014-01-25 MED ORDER — OXYCODONE HCL 5 MG PO TABS: 5.0000 mg | ORAL_TABLET | ORAL | Status: AC | PRN

## 2014-01-25 NOTE — Progress Notes (Signed)
Pt educated. Iv removed. Pt discharged to home with family.

## 2014-01-25 NOTE — Progress Notes (Signed)
UR completed 

## 2014-01-25 NOTE — Progress Notes (Signed)
Subjective: Pt stable - pain ok with percocet   Objective: Vital signs in last 24 hours: Temp:  [97.6 F (36.4 C)-98.1 F (36.7 C)] 98 F (36.7 C) (11/18 0600) Pulse Rate:  [62-85] 66 (11/18 0600) Resp:  [12-22] 18 (11/18 0600) BP: (95-126)/(42-74) 101/45 mmHg (11/18 0600) SpO2:  [98 %-100 %] 100 % (11/18 0600) FiO2 (%):  [2 %] 2 % (11/18 0600) Weight:  [84.301 kg (185 lb 13.6 oz)] 84.301 kg (185 lb 13.6 oz) (11/17 1052)  Intake/Output from previous day: 11/17 0701 - 11/18 0700 In: 1000 [I.V.:1000] Out: -  Intake/Output this shift:    Exam:  Dorsiflexion/Plantar flexion intact foot perfused   Labs: No results for input(s): HGB in the last 72 hours. No results for input(s): WBC, RBC, HCT, PLT in the last 72 hours.  Recent Labs  01/24/14 1100  NA 138  K 4.1  CL 103  CO2 21  BUN 14  CREATININE 0.78  GLUCOSE 78  CALCIUM 9.5    Recent Labs  01/24/14 1100  INR 0.98    Assessment/Plan: Plan dc today after PT and Dressing change   [name] 01/25/2014, 6:45 AM

## 2014-01-25 NOTE — Care Management Note (Signed)
CARE MANAGEMENT NOTE 01/25/2014  Patient:  [name],[name]   Account Number:  [id number]  Date Initiated:  01/25/2014  Documentation initiated by:  Vance PeperBRADY,Ekansh Sherk  Subjective/Objective Assessment:   28 yr old female admitted with right knee lateral meniscal tear. Patient had a right knee arthroscopy with lateral meniscus repair.     Action/Plan:   Patient has no home health needs. CPM to be delivered this afternoon. Patient has support at discharge.   Anticipated DC Date:  01/25/2014   Anticipated DC Plan:  HOME/SELF CARE         PAC Choice  DURABLE MEDICAL EQUIPMENT   Choice offered to / List presented to:     DME arranged  CPM      DME agency  [employer]     HH arranged  NA      Status of service:  Completed, signed off Medicare Important Message given?   (If response is "NO", the following Medicare IM given date fields will be blank) Date Medicare IM given:   Medicare IM given by:   Date Additional Medicare IM given:   Additional Medicare IM given by:    Discharge Disposition:  HOME/SELF CARE  Per UR Regulation:  Reviewed for med. necessity/level of care/duration of stay  If discussed at Long Length of Stay Meetings, dates discussed

## 2014-01-25 NOTE — Anesthesia Postprocedure Evaluation (Signed)
  Anesthesia Post-op Note  Patient: [name]  Procedure(s) Performed: Procedure(s): RIGHT KNEE ARTHROSCOPY WITH LATERAL MENISCAL REPAIR (Right)  Patient Location: PACU  Anesthesia Type:General  Level of Consciousness: awake  Airway and Oxygen Therapy: Patient Spontanous Breathing  Post-op Pain: mild  Post-op Assessment: Post-op Vital signs reviewed, Patient's Cardiovascular Status Stable, Respiratory Function Stable, Patent Airway, No signs of Nausea or vomiting and Pain level controlled  Post-op Vital Signs: Reviewed and stable  Last Vitals:  Filed Vitals:   01/25/14 0600  BP: 101/45  Pulse: 66  Temp: 36.7 C  Resp: 18    Complications: No apparent anesthesia complications

## 2014-01-25 NOTE — Op Note (Signed)
NAMRoyal [name]:  [name]                 ACCOUNT NO.:  [id number]  MEDICAL RECORD NO.:  [id number]  LOCATION:  5N01C                        FACILITY:  [facility]  PHYSICIAN:  Burnard BuntingG. Scott Dean, M.D.    DATE OF BIRTH:  1985/11/30  DATE OF PROCEDURE: DATE OF DISCHARGE:                              OPERATIVE REPORT   PREOPERATIVE DIAGNOSIS:  Right knee displaced lateral meniscal tear.  POSTOPERATIVE DIAGNOSIS:  Right knee displaced lateral meniscal tear.  PROCEDURE:  Right knee arthroscopy, inside-out lateral meniscal repair.  SURGEON:  Burnard BuntingG. Scott Dean, M.D.  ASSISTANT:  Eugenia McalpineMichael Chew.  ANESTHESIA:  General.  INDICATIONS:  [name] is a patient with DVT on pulmonary embolism who has locked meniscal tear laterally proven by MRI scan, presents now for operative management.  Social history risks and benefits after explanation of risks and benefits.  PROCEDURE IN DETAIL:  The patient was brought to the operating room where general endotracheal anesthesia was induced.  Preoperative antibiotics administered.  Time-out was called.  Left leg was fitted with an SCD.  Right leg was prescrubbed with alcohol and Betadine, allowed to air dry. Prepped with DuraPrep solution and draped in sterile manner.  Ioban used to cover the operative field.  Tourniquet was not utilized for the right leg.  Anterior inferior lateral and anterior inferior medial portals were established.  Diagnostic arthroscopy was performed.  ACL and PCL intact.  Patellofemoral joint was intact. Medial compartment, articular cartilage, and meniscus were intact. Lateral meniscus had a tear in the red zone for about 2.5 cm beginning anterior to the popliteus and extending to a centimeter from its attachment site.  This area was repaired initially with the rasps and then with smooth shaver.  Then at this time, the probe was used to probe to the joint line which was then palpated and incision was made essentially at the posterior aspect of the  lateral femoral condyle. Skin and subcutaneous tissue were sharply divided.  Care was taken to avoid injuring the peroneal nerve which was out of the field.  Inferior portion of the iliotibial band was split along parallel to the fiber orientation.  Plane was developed between the capsule and the lateral head of gastroc.  At this time, 7  vertical mattress, horizontal mattress sutures were placed across the tear using Arthrex inside-out meniscal passing device.  The posterior neurovascular structures were protected with a speculum.  Following suture passage, the sutures were tied from medial to lateral and with the knee in about 30 degrees of flexion gave excellent repair of the meniscus.  This incision was then thoroughly irrigated.  The iliotibial band was closed using #1 Vicryl followed by interrupted inverted 0 Vicryl; 2-0 Vicryl, and running 3-0 pullout Prolene.  Skin edges were anesthetized with Marcaine.  The joint was anesthetized as well with Marcaine, morphine, and clonidine. Portals were closed using 3-0 nylon.  Transpatellar portal was required. Bulky dressing was applied and allowed the patient to bend the knee but not past 90 degrees.  Weightbearing as tolerated with crutches.  Follow up in 7 days.  She did have a Greenfield filter placed.     Burnard BuntingG. Scott Dean, M.D.  GSD/MEDQ  D:  01/24/2014  T:  01/25/2014  Job:  [id number]

## 2014-01-25 NOTE — Evaluation (Signed)
Physical Therapy Evaluation and Discharge  Patient Details Name: [name] MRN: [id number] DOB: 1985-09-11 Today's Date: 01/25/2014   History of Present Illness  Pt is a 28 y.o. s/p Rt medial meniscus repair.   Clinical Impression  Patient evaluated by Physical Therapy with no further acute PT needs identified. All education regarding mobility, crutch training, stair management and home setup has been completed and the patient has no further questions. See below for any follow-up Physial Therapy or equipment needs. PT is signing off. Thank you for this referral.     Follow Up Recommendations Outpatient PT;Other (comment);Supervision/Assistance - 24 hour    Equipment Recommendations  None recommended by PT  (awaiting CPM per MD order)    Recommendations for Other Services       Precautions / Restrictions Precautions Precautions: None Required Braces or Orthoses: Other Brace/Splint Other Brace/Splint: ACE wrap Restrictions Weight Bearing Restrictions: Yes RLE Weight Bearing: Touchdown weight bearing      Mobility  Bed Mobility Overal bed mobility: Modified Independent             General bed mobility comments: incr time; no physical (A) needed  Transfers Overall transfer level: Needs assistance Equipment used: Crutches Transfers: Sit to/from Stand Sit to Stand: Supervision         General transfer comment: cues for technique and safety with crutches   Ambulation/Gait Ambulation/Gait assistance: Supervision Ambulation Distance (Feet): 80 Feet Assistive device: Crutches Gait Pattern/deviations: Step-to pattern Gait velocity: decreased Gait velocity interpretation: Below normal speed for age/gender General Gait Details: cues for safe technique with crutches; no LOB noted with ambulation; supervision for safety   Stairs Stairs: Yes Stairs assistance: Min guard Stair Management: No rails;One rail Right;Step to pattern;With  crutches;Sideways;Forwards Number of Stairs: 4 General stair comments: educated pt and friend on multiple techniques and practiced 2 specific techniques; pt more comfortable with using crutch in Lt UE and handrail in Rt UE  Wheelchair Mobility    Modified Rankin (Stroke Patients Only)       Balance Overall balance assessment: No apparent balance deficits (not formally assessed);Needs assistance Sitting-balance support: Feet supported;No upper extremity supported Sitting balance-Leahy Scale: Good Sitting balance - Comments: denied any dizziness   Standing balance support: During functional activity;Bilateral upper extremity supported Standing balance-Leahy Scale: Fair Standing balance comment: able to stand and adjust crutches without LOB                             Pertinent Vitals/Pain Pain Assessment: 0-10 Pain Score: 7  Pain Location: Rt knee Pain Descriptors / Indicators: Sore Pain Intervention(s): Monitored during session;Premedicated before session;Repositioned    Home Living Family/patient expects to be discharged to:: Private residence Living Arrangements: Alone Available Help at Discharge: Family;Friend(s);Available 24 hours/day Type of Home: Apartment Home Access: Stairs to enter Entrance Stairs-Rails: Right Entrance Stairs-Number of Steps: 12 Home Layout: One level Home Equipment: Walker - 2 wheels;Crutches;Shower seat Additional Comments: pt been ambulating on crutches since initial injury     Prior Function Level of Independence: Independent         Comments: 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 [employer]        Extremity/Trunk Assessment   Upper Extremity Assessment: Overall WFL for tasks assessed           Lower Extremity Assessment: RLE deficits/detail RLE Deficits / Details: Pt with ACE wrap; limited ROM  Cervical / Trunk Assessment: Normal  Communication   Communication: No difficulties  Cognition  Arousal/Alertness: Awake/alert Behavior During Therapy: WFL for tasks assessed/performed Overall Cognitive Status: Within Functional Limits for tasks assessed                      General Comments General comments (skin integrity, edema, etc.): educated on CPM machine     Exercises        Assessment/Plan    PT Assessment Patent does not need any further PT services;All further PT needs can be met in the next venue of care  PT Diagnosis Difficulty walking;Acute pain;Generalized weakness   PT Problem List Decreased strength;Decreased range of motion;Decreased balance;Decreased mobility;Decreased knowledge of use of DME;Decreased safety awareness;Pain  PT Treatment Interventions DME instruction;Gait training;Stair training;Functional mobility training;Therapeutic activities;Patient/family education;Neuromuscular re-education;Balance training   PT Goals (Current goals can be found in the Care Plan section) Acute Rehab PT Goals Patient Stated Goal: to go home today PT Goal Formulation: All assessment and education complete, DC therapy    Frequency     Barriers to discharge        Co-evaluation               End of Session Equipment Utilized During Treatment: Gait belt Activity Tolerance: Patient tolerated treatment well Patient left: in bed;in CPM;with call bell/phone within reach;with family/visitor present Nurse Communication: Mobility status;Precautions;Weight bearing status    Functional Assessment Tool Used: clinical judgement  Functional Limitation: Mobility: Walking and moving around Mobility: Walking and Moving Around Current Status ([id number]): At least 1 percent but less than 20 percent impaired, limited or restricted Mobility: Walking and Moving Around Goal Status [phone]): At least 1 percent but less than 20 percent impaired, limited or restricted Mobility: Walking and Moving Around Discharge Status [phone]): At least 1 percent but less than 20 percent  impaired, limited or restricted    Time: 0753-0821 PT Time Calculation (min) (ACUTE ONLY): 28 min   Charges:   PT Evaluation $Initial PT Evaluation Tier I: 1 Procedure PT Treatments $Gait Training: 23-37 mins   PT G Codes:   Functional Assessment Tool Used: clinical judgement  Functional Limitation: Mobility: Walking and moving around    [place], [place], [place]  [phone] 01/25/2014, 9:00 AM

## 2014-02-04 NOTE — Discharge Summary (Signed)
Physician Discharge Summary  Patient ID: [name] MRN: [id number] DOB/AGE: [birth year] 26 y.o.  Admit date: 01/24/2014 Discharge date: 01/25/2014 Admission Diagnoses:  Active Problems:   Lateral meniscal tear   Discharge Diagnoses:  Same  Surgeries: Procedure(s): RIGHT KNEE ARTHROSCOPY WITH LATERAL MENISCAL REPAIR on 01/24/2014   Consultants:    Discharged Condition: Stable  Hospital Course: [name] is an 28 y.o. female who was admitted 01/24/2014 with a chief complaint of knee pain, and found to have a diagnosis of unstable lateral meniscal tear.  They were brought to the operating room on 01/24/2014 and underwent the above named procedures. She was started on Xarelto for DVT prophylaxis and had a [place] filter placed on the day of surgery which will be removed in 6 weeks. Plan on weightbearing and range of motion of the knee as tolerated up to 90   Antibiotics given:  Anti-infectives    Start     Dose/Rate Route Frequency Ordered Stop   01/24/14 2000  ceFAZolin (ANCEF) IVPB 2 g/50 mL premix     2 g100 mL/hr over 30 Minutes Intravenous Every 6 hours 01/24/14 1832 01/25/14 0855   01/24/14 0600  ceFAZolin (ANCEF) IVPB 2 g/50 mL premix  Status:  Discontinued     2 g100 mL/hr over 30 Minutes Intravenous On call to O.R. 01/23/14 1505 01/24/14 1410    .  Recent vital signs:  Filed Vitals:   01/25/14 0600  BP: 101/45  Pulse: 66  Temp: 98 F (36.7 C)  Resp: 18    Recent laboratory studies:  Results for orders placed or performed during the hospital encounter of 01/24/14  Basic metabolic panel  Result Value Ref Range   Sodium 138 137 - 147 mEq/L   Potassium 4.1 3.7 - 5.3 mEq/L   Chloride 103 96 - 112 mEq/L   CO2 21 19 - 32 mEq/L   Glucose, Bld 78 70 - 99 mg/dL   BUN 14 6 - 23 mg/dL   Creatinine, Ser 4.09 0.50 - 1.10 mg/dL   Calcium 9.5 8.4 - 81.1 mg/dL   GFR calc non Af Amer >90 >90 mL/min   GFR calc Af Amer >90 >90 mL/min   Anion gap 14 5 - 15   Protime-INR  Result Value Ref Range   Prothrombin Time 13.1 11.6 - 15.2 seconds   INR 0.98 0.00 - 1.49    Discharge Medications:     Medication List    STOP taking these medications        oxyCODONE-acetaminophen 5-325 MG per tablet  Commonly known as:  ROXICET      TAKE these medications        acetaminophen 500 MG tablet  Commonly known as:  TYLENOL  Take 500 mg by mouth every 6 (six) hours as needed.     methocarbamol 500 MG tablet  Commonly known as:  ROBAXIN  Take 1 tablet (500 mg total) by mouth every 6 (six) hours as needed for muscle spasms.     oxyCODONE 5 MG immediate release tablet  Commonly known as:  Oxy IR/ROXICODONE  Take 1-2 tablets (5-10 mg total) by mouth every 3 (three) hours as needed for breakthrough pain.     rivaroxaban 20 MG Tabs tablet  Commonly known as:  XARELTO  Take 20 mg by mouth daily with supper.        Diagnostic Studies: Ir Ivc Filter Plmt / S&i /img Guid/mod Sed  01/24/2014   INDICATION: 28 year old with history of pulmonary  embolism and right lower extremity DVT. Patient scheduled for knee surgery and needs pulmonary embolism prophylaxis. Patient has a buckle handle tear of the lateral meniscus.  EXAM: IVC FILTER PLACEMENT; IVC VENOGRAM; ULTRASOUND FOR VASCULAR ACCESS  Physician: Rachelle HoraAdam R. Henn, MD  FLUOROSCOPY TIME:  6 min and 6 seconds  MEDICATIONS: 2 mg versed, 200 mcg fentanyl. A radiology nurse monitored the patient for moderate sedation.  ANESTHESIA/SEDATION: Moderate sedation time: 60 minutes  CONTRAST:  100 mL Omnipaque 300  PROCEDURE: Informed consent was obtained for an IVC venogram and filter placement. The left groin was prepped and draped in a sterile fashion. Maximal barrier sterile technique was utilized including caps, mask, sterile gowns, sterile gloves, sterile drape, hand hygiene and skin antiseptic. Ultrasound demonstrated unusual vascular configuration in the left groin. The left common femoral vein appeared to be patent  but there was concern for an aberrant arterial structure just anterior to the left common femoral vein. This was difficult to delineate with ultrasound. The left groin was anesthetized with 1% lidocaine. A 21 gauge needle was directed towards the left common femoral vein but there was pulsatile arterial flow. Small amount of contrast confirmed placement in an artery. A second attempt was made to cannulate the left common femoral vein but it appeared that the needle was traversing a small arterial branch. As a result, manual compression was applied over the left groin. No evidence for hematoma or bleeding. Attention was directed to the right neck.  Ultrasound demonstrated a patent right internal jugular vein. Ultrasound images were obtained for documentation. The right side of the neck was prepped and draped in a sterile fashion. Maximal barrier sterile technique was utilized including caps, mask, sterile gowns, sterile gloves, sterile drape, hand hygiene and skin antiseptic. The skin was anesthetized with 1% lidocaine. A 21 gauge needle was directed into the vein with ultrasound guidance and a micropuncture dilator set was placed. A wire was advanced into the IVC. The filter sheath was advanced over the wire into the IVC. An IVC venogram was performed. The right renal vein was cannulated with a 5 French catheter and wire. Unable to cannulate the left renal vein. Review of previous abdominal CT demonstrates that the right renal vein was more caudal within the left renal vein. A Bard Denali filter was deployed below the right renal vein. A follow-up venogram was performed and the vascular sheath was removed with manual compression.  FINDINGS: Left groin: There appears to be three arterial structures just anterior to the left common femoral vein. This configuration is unusual and may represent an early takeoff of profunda femoral branch. Unable to cannulate the left common femoral vein without traversing an arterial  branch. A wire and sheath were not advanced through this access. Manual compression was applied to the left groin puncture site. No evidence for bleeding or hematoma at the end of the procedure.  IVC is patent. The inferior vena cava just below the right renal vein measures 24 mm. The left renal vein could not be clearly identified but it appears to be more cephalad than the right renal vein based on a CT examination from 12/18/2012. IVC filter was successful placed below the right renal vein at the level of L1-L2.  COMPLICATIONS: None  IMPRESSION: Successful placement of a retrievable IVC filter.   Electronically Signed   By: Richarda OverlieAdam  Henn M.D.   On: 01/24/2014 17:27   [place] Venous Img Lower Unilateral Right  01/17/2014   CLINICAL DATA:  28 year old female with  a history of right lower extremity DVT, involving the right femoral vein and popliteal vein. She is status post Angiojet thrombectomy 12/26/2013. Known risk factors include immobility given her associated right knee trauma.  EXAM: RIGHT LOWER EXTREMITY VENOUS DOPPLER ULTRASOUND  TECHNIQUE: Gray-scale sonography with graded compression, as well as color Doppler and duplex ultrasound were performed to evaluate the lower extremity deep venous systems from the level of the common femoral vein and including the common femoral, femoral, profunda femoral, popliteal and calf veins including the posterior tibial, peroneal and gastrocnemius veins when visible. The superficial great saphenous vein was also interrogated. Spectral Doppler was utilized to evaluate flow at rest and with distal augmentation maneuvers in the common femoral, femoral and popliteal veins.  COMPARISON:  None.  FINDINGS: Common Femoral Vein: No evidence of thrombus. Normal compressibility and flow on color Doppler imaging. Normal respiratory phasicity and augmentation.  Saphenofemoral Junction: No evidence of thrombus. Normal compressibility and flow on color Doppler imaging.  Profunda Femoral  Vein: No evidence of thrombus. Normal compressibility and flow on color Doppler imaging.  Femoral Vein: The length of the femoral vein from the saphenous femoral junction to the distal femoral vein patent. Normal compressibility with normal flow. The very distal femoral vein with transition to the popliteal vein is noncompressible.  Popliteal Vein: Noncompressible with partially recannulized flow secondary to thrombus.  Calf Veins: Thrombus extends from the popliteal vein distally into the proximal posterior tibial veins.  Superficial Great Saphenous Vein: No evidence of thrombus. Normal compressibility and flow on color Doppler imaging.  Venous Reflux:  None.  Other Findings:  None.  IMPRESSION: Patent right common femoral vein, and right femoral vein, status post Angiojet thrombectomy.  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  Signed,  Yvone NeuJaime S. Loreta AveWagner, DO  [facility]  [facility]   Electronically Signed   By: Gilmer MorJaime  Wagner D.O.   On: 01/17/2014 15:37    Disposition: 01-Home or Self Care      Discharge Instructions    Call MD / Call 911    Complete by:  As directed   If you experience chest pain or shortness of breath, CALL 911 and be transported to the hospital emergency room.  If you develope a fever above 101 F, pus (white drainage) or increased drainage or redness at the wound, or calf pain, call your surgeon's office.     Change dressing    Complete by:  As directed   Please change dressing use mepilex over both incisions     Constipation Prevention    Complete by:  As directed   Drink plenty of fluids.  Prune juice may be helpful.  You may use a stool softener, such as Colace (over the counter) 100 mg twice a day.  Use MiraLax (over the counter) for constipation as needed.     Diet - low sodium heart healthy     Complete by:  As directed      Discharge instructions    Complete by:  As directed   OK to weight bear with crutches CPM 1 hour 3 times a day - do not go past 90 degrees Keep incisions dry     Increase activity slowly as tolerated    Complete by:  As directed               Signed: DEAN,GREGORY SCOTT 02/04/2014, 9:15  PM

## 2014-02-15 ENCOUNTER — Other Ambulatory Visit: Payer: Self-pay | Admitting: Diagnostic Radiology

## 2014-02-15 DIAGNOSIS — I82401 Acute embolism and thrombosis of unspecified deep veins of right lower extremity: Secondary | ICD-10-CM

## 2014-02-16 ENCOUNTER — Telehealth: Payer: Self-pay | Admitting: Emergency Medicine

## 2014-02-16 NOTE — Telephone Encounter (Signed)
CALLED PT TO SEE IF SHE IS STILL TAKING XARELTO, AND SHE IS.  WANTED TO KNOW IF SHE IS READY FOR THE IVC FILTER TO BE REMOVED AND SHE WOULD LIKE TO WAIT UNTIL JAN. 2016. [facility]

## 2014-02-24 ENCOUNTER — Telehealth (HOSPITAL_COMMUNITY): Payer: Self-pay | Admitting: Diagnostic Radiology

## 2014-02-24 NOTE — Telephone Encounter (Signed)
Called pt to schedule her IVC filter removal. Pt would like to wait until I have the doctors schedule for January as she would like to have Dr. Lowella DandyHenn for her procedure. I told her I would call her back once the January 2016 schedules came out.  She was in agreement with this care plan. JM

## 2014-03-13 ENCOUNTER — Other Ambulatory Visit: Payer: Self-pay | Admitting: Radiology

## 2014-03-14 ENCOUNTER — Other Ambulatory Visit: Payer: Self-pay | Admitting: Radiology

## 2014-03-15 ENCOUNTER — Encounter (HOSPITAL_COMMUNITY): Payer: Self-pay

## 2014-03-15 ENCOUNTER — Ambulatory Visit (HOSPITAL_COMMUNITY)
Admission: RE | Admit: 2014-03-15 | Discharge: 2014-03-15 | Disposition: A | Discharge status: Home / Self Care | Source: Ambulatory Visit | Attending: Diagnostic Radiology | Admitting: Diagnostic Radiology

## 2014-03-15 ENCOUNTER — Other Ambulatory Visit: Payer: Self-pay | Admitting: Radiology

## 2014-03-15 DIAGNOSIS — Z86718 Personal history of other venous thrombosis and embolism: Secondary | ICD-10-CM | POA: Insufficient documentation

## 2014-03-15 DIAGNOSIS — I82401 Acute embolism and thrombosis of unspecified deep veins of right lower extremity: Secondary | ICD-10-CM

## 2014-03-15 DIAGNOSIS — Z86711 Personal history of pulmonary embolism: Secondary | ICD-10-CM | POA: Diagnosis not present

## 2014-03-15 DIAGNOSIS — Z452 Encounter for adjustment and management of vascular access device: Secondary | ICD-10-CM | POA: Insufficient documentation

## 2014-03-15 LAB — CBC
HEMATOCRIT: 39.2 % (ref 36.0–46.0)
HEMOGLOBIN: 13.2 g/dL (ref 12.0–15.0)
MCH: 32.1 pg (ref 26.0–34.0)
MCHC: 33.7 g/dL (ref 30.0–36.0)
MCV: 95.4 fL (ref 78.0–100.0)
Platelets: 375 10*3/uL (ref 150–400)
RBC: 4.11 MIL/uL (ref 3.87–5.11)
RDW: 12.4 % (ref 11.5–15.5)
WBC: 5.4 10*3/uL (ref 4.0–10.5)

## 2014-03-15 LAB — BASIC METABOLIC PANEL
ANION GAP: 8 (ref 5–15)
BUN: 12 mg/dL (ref 6–23)
CALCIUM: 9.4 mg/dL (ref 8.4–10.5)
CO2: 24 mmol/L (ref 19–32)
CREATININE: 0.77 mg/dL (ref 0.50–1.10)
Chloride: 104 mEq/L (ref 96–112)
GFR calc Af Amer: >90 mL/min (ref >90)
Glucose, Bld: 85 mg/dL (ref 70–99)
POTASSIUM: 3.9 mmol/L (ref 3.5–5.1)
SODIUM: 136 mmol/L (ref 135–145)

## 2014-03-15 LAB — PROTIME-INR
INR: 1.03 (ref 0.00–1.49)
Prothrombin Time: 13.6 seconds (ref 11.6–15.2)

## 2014-03-15 LAB — APTT: aPTT: 24 seconds (ref 24–37)

## 2014-03-15 LAB — PREGNANCY, URINE: Preg Test, Ur: NEGATIVE

## 2014-03-15 MED ORDER — SODIUM CHLORIDE 0.9 % IV SOLN
Freq: Once | INTRAVENOUS | Status: AC
  Administered 2014-03-15: 11:00:00 via INTRAVENOUS

## 2014-03-15 MED ORDER — MIDAZOLAM HCL 2 MG/2ML IJ SOLN
INTRAMUSCULAR | Status: AC | PRN
  Administered 2014-03-15 (×2): 1 mg via INTRAVENOUS

## 2014-03-15 MED ORDER — MIDAZOLAM HCL 2 MG/2ML IJ SOLN
INTRAMUSCULAR | Status: AC
  Filled 2014-03-15: qty 4

## 2014-03-15 MED ORDER — MIDAZOLAM HCL 2 MG/2ML IJ SOLN
INTRAMUSCULAR | Status: AC
  Filled 2014-03-15: qty 2

## 2014-03-15 MED ORDER — IOHEXOL 300 MG/ML  SOLN
100.0000 mL | Freq: Once | INTRAMUSCULAR | Status: AC | PRN
  Administered 2014-03-15: 90 mL via INTRAVENOUS

## 2014-03-15 MED ORDER — FENTANYL CITRATE 0.05 MG/ML IJ SOLN
INTRAMUSCULAR | Status: AC
  Filled 2014-03-15: qty 4

## 2014-03-15 MED ORDER — LIDOCAINE HCL 1 % IJ SOLN
INTRAMUSCULAR | Status: AC
  Filled 2014-03-15: qty 20

## 2014-03-15 MED ORDER — FENTANYL CITRATE 0.05 MG/ML IJ SOLN
INTRAMUSCULAR | Status: AC
  Filled 2014-03-15: qty 2

## 2014-03-15 MED ORDER — FENTANYL CITRATE 0.05 MG/ML IJ SOLN
INTRAMUSCULAR | Status: AC | PRN
  Administered 2014-03-15: 50 ug via INTRAVENOUS
  Administered 2014-03-15 (×2): 25 ug via INTRAVENOUS
  Administered 2014-03-15: 50 ug via INTRAVENOUS

## 2014-03-15 NOTE — Progress Notes (Addendum)
Sleeping , no complaints

## 2014-03-15 NOTE — H&P (Signed)
Chief Complaint: IVC filter removal  Referring Physician(s): Henn,Adam Ryan Dr Providence Lanius; Dr August Saucer  History of Present Illness: [name] is a 29 y.o. female   Pt with hx RLE DVT/PE Sept 2015 Angiojet thrombolysis in IR 12/26/2013 Placed on Xarelto underwent R knee surgery 01/24/14---off Xarelto for this IR placed retrievable Inferior vena cava filter 11/17 prior to surgery same day Back on Xarelto post surgery Has done well Denies sob or pain Denies R leg swelling Now scheduled for IVC filter removal Continuing Xarelto; plan for Re CT in March 2016 with Dr Providence Lanius-- May possibly dc Xarelto then  Past Medical History  Diagnosis Date  . 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 PE (pulmonary embolism) 10/15  . Shortness of breath dyspnea     with PE     Past Surgical History  Procedure Laterality Date  . Wisdom tooth extraction    . Knee arthroscopy w/ meniscal repair Right 01/24/2014    dr dean  . Thrombectomy Right 01/2014    dr Loreta Ave  . Knee arthroscopy with meniscal repair Right 01/24/2014    Procedure: RIGHT KNEE ARTHROSCOPY WITH LATERAL MENISCAL REPAIR;  Surgeon: Cammy Copa, MD;  Location: [facility] OR;  Service: Orthopedics;  Laterality: Right;    Allergies: Triaminic  Medications: Prior to Admission medications   Medication Sig Start Date End Date Taking? Authorizing Provider  acetaminophen (TYLENOL) 500 MG tablet Take 500 mg by mouth every 6 (six) hours as needed (pain).    Yes Historical Provider, MD  methocarbamol (ROBAXIN) 500 MG tablet Take 1 tablet (500 mg total) by mouth every 6 (six) hours as needed for muscle spasms. 01/25/14  Yes Cammy Copa, MD  oxyCODONE (OXY IR/ROXICODONE) 5 MG immediate release tablet Take 1-2 tablets (5-10 mg total) by mouth every 3 (three) hours as needed for breakthrough pain. 01/25/14  Yes Cammy Copa, MD  rivaroxaban (XARELTO) 20 MG TABS tablet Take 20 mg by mouth daily with supper.   Yes Historical  Provider, MD    History reviewed. No pertinent family history.  History   Social History  . Marital Status: Single    Spouse Name: N/A    Number of Children: N/A  . Years of Education: N/A   Social History Main Topics  . Smoking status: Never Smoker   . Smokeless tobacco: Never Used  . Alcohol Use: No  . Drug Use: No  . Sexual Activity: None   Other Topics Concern  . None   Social History Narrative     Review of Systems: A 12 point ROS discussed and pertinent positives are indicated in the HPI above.  All other systems are negative.  Review of Systems  Constitutional: Negative for fever and activity change.  Respiratory: Negative for cough, chest tightness and shortness of breath.   Cardiovascular: Negative for chest pain and leg swelling.  Gastrointestinal: Negative for abdominal pain.  Musculoskeletal: Negative for back pain, joint swelling and gait problem.  Neurological: Negative for weakness.  Psychiatric/Behavioral: Negative for behavioral problems and confusion.     Vital Signs: BP 123/75 mmHg  Pulse 73  Temp(Src) 98.1 F (36.7 C) (Oral)  Resp 20  Ht  (1.676 m)  Wt 77.565 kg (171 lb)  BMI 27.61 kg/m2  SpO2 99%  LMP 02/26/2014  Physical Exam  Constitutional: She is oriented to person, place, and time. She appears well-nourished.  Cardiovascular: Normal rate and normal heart sounds.   No  murmur heard. Pulmonary/Chest: Effort normal and breath sounds normal. She has no wheezes.  Abdominal: Soft. Bowel sounds are normal. There is no tenderness.  Musculoskeletal: Normal range of motion.  Neurological: She is alert and oriented to person, place, and time.  Skin: Skin is warm and dry.  Psychiatric: She has a normal mood and affect. Her behavior is normal. Judgment and thought content normal.  Nursing note and vitals reviewed.   Imaging: No results found.  Labs:  CBC:  Recent Labs  12/26/13 0326 12/27/13 0636 01/19/14 1547 03/15/14 1106    WBC 6.4 5.8 7.2 5.4  HGB 12.6 12.5 12.6 13.2  HCT 37.3 37.6 37.0 39.2  PLT 346 334 381 375    COAGS:  Recent Labs  12/24/13 0833  12/25/13 1914 12/26/13 0326 12/27/13 0636 01/24/14 1100 03/15/14 1106  INR 1.39  --   --   --   --  0.98 1.03  APTT  --   < > 113* 109* 99*  --  24  < > = values in this interval not displayed.  BMP:  Recent Labs  12/22/13 2115 12/23/13 0516 12/26/13 0326 01/24/14 1100  NA 136* 136* 133* 138  K 3.5* 3.6* 4.2 4.1  CL 99 101 100 103  CO2 24 21 21 21   GLUCOSE 100* 103* 89 78  BUN 13 11 12 14   CALCIUM 9.7 9.4 9.0 9.5  CREATININE 0.87 0.81 0.82 0.78  GFRNONAA >90 >90 >90 >90  GFRAA >90 >90 >90 >90    LIVER FUNCTION TESTS:  Recent Labs  12/22/13 2115  BILITOT <0.2*  AST 16  ALT 9  ALKPHOS 38*  PROT 8.2  ALBUMIN 3.0*    TUMOR MARKERS: No results for input(s): AFPTM, CEA, CA199, CHROMGRNA in the last 8760 hours.  Assessment and Plan:  + RLE DVT/PE 11/2013 Angiojet lysis and placed on Xarelto 12/2013 Had R knee surgery 11/17---IVC filter placed prior to surgery same day Back on Xarelto post surgery Now for IVC filter removal Pt aware of procedure benefits and risks and agreeable to proceed Consent signed andin chart  Thank you for this interesting consult.  I greatly enjoyed meeting Jann B Keidel and look forward to participating in their care.   I spent a total of 20 minutes face to face in clinical consultation, greater than 50% of which was counseling/coordinating care for IVC filter removal  Signed: Diani Jillson A 03/15/2014, 12:18 PM

## 2014-03-15 NOTE — Discharge Instructions (Signed)
Wound Care  HOME CARE   Only take medicine as told by your doctor.  Change any bandages (dressings) as told by your doctor. Remove dressing in 24 hrs and you can place a band aid for the next 24 hs, then you may leave uncovered.   Change the bandage if it gets wet, dirty, or starts to smell.  Take showers in 24 hours. Do not take baths, swim, or do anything that puts your wound under water.  Keep all doctor visits as told. GET HELP RIGHT AWAY IF:   Yellowish-white fluid (pus) comes from the wound.  Medicine does not lessen your pain.  There is a red streak going away from the wound.  You have a fever. MAKE SURE YOU:   Understand these instructions.  Will watch your condition.  Will get help right away if you are not doing well or get worse. Document Released: 12/04/2007 Document Revised: 05/19/2011 Document Reviewed: 06/30/2010 [facility] Patient Information 2015 [employer] [place]. This information is not intended to replace advice given to you by your health care provider. Make sure you discuss any questions you have with your health care provider.

## 2014-03-15 NOTE — Sedation Documentation (Signed)
Patient denies pain and is resting comfortably.  

## 2014-03-15 NOTE — Procedures (Signed)
Successful retrieval of the IVC filter.  No immediate complication.  See dictated report.

## 2014-08-30 ENCOUNTER — Ambulatory Visit (HOSPITAL_COMMUNITY)

## 2014-08-30 ENCOUNTER — Other Ambulatory Visit (HOSPITAL_COMMUNITY): Payer: Self-pay | Admitting: Orthopedic Surgery

## 2014-08-30 DIAGNOSIS — M25561 Pain in right knee: Secondary | ICD-10-CM

## 2014-09-14 ENCOUNTER — Ambulatory Visit (HOSPITAL_COMMUNITY)

## 2014-09-15 ENCOUNTER — Ambulatory Visit (HOSPITAL_COMMUNITY)
Admission: RE | Admit: 2014-09-15 | Discharge: 2014-09-15 | Disposition: A | Discharge status: Home / Self Care | Source: Ambulatory Visit | Attending: Orthopedic Surgery | Admitting: Orthopedic Surgery

## 2014-09-15 DIAGNOSIS — G8929 Other chronic pain: Secondary | ICD-10-CM | POA: Diagnosis not present

## 2014-09-15 DIAGNOSIS — M94261 Chondromalacia, right knee: Secondary | ICD-10-CM | POA: Insufficient documentation

## 2014-09-15 DIAGNOSIS — M25561 Pain in right knee: Secondary | ICD-10-CM | POA: Diagnosis present

## 2014-09-15 MED ORDER — LIDOCAINE HCL (PF) 1 % IJ SOLN
INTRAMUSCULAR | Status: AC
  Filled 2014-09-15: qty 10

## 2014-09-15 MED ORDER — IOHEXOL 180 MG/ML  SOLN
20.0000 mL | Freq: Once | INTRAMUSCULAR | Status: AC | PRN
  Administered 2014-09-15: 26 mL via INTRA_ARTICULAR

## 2014-09-15 MED ORDER — GADOBENATE DIMEGLUMINE 529 MG/ML IV SOLN
5.0000 mL | Freq: Once | INTRAVENOUS | Status: AC | PRN
  Administered 2014-09-15: 0.1 mL via INTRA_ARTICULAR

## 2014-11-17 IMAGING — CR DG SHOULDER 2+V*R*
3 series · 3 of 3 positions shown · non-contrast
Comparison: None.

CLINICAL DATA: Right posterior shoulder pain.  No known injury.

EXAM:
RIGHT SHOULDER - 2+ VIEW

[w shoulder ap external righ]
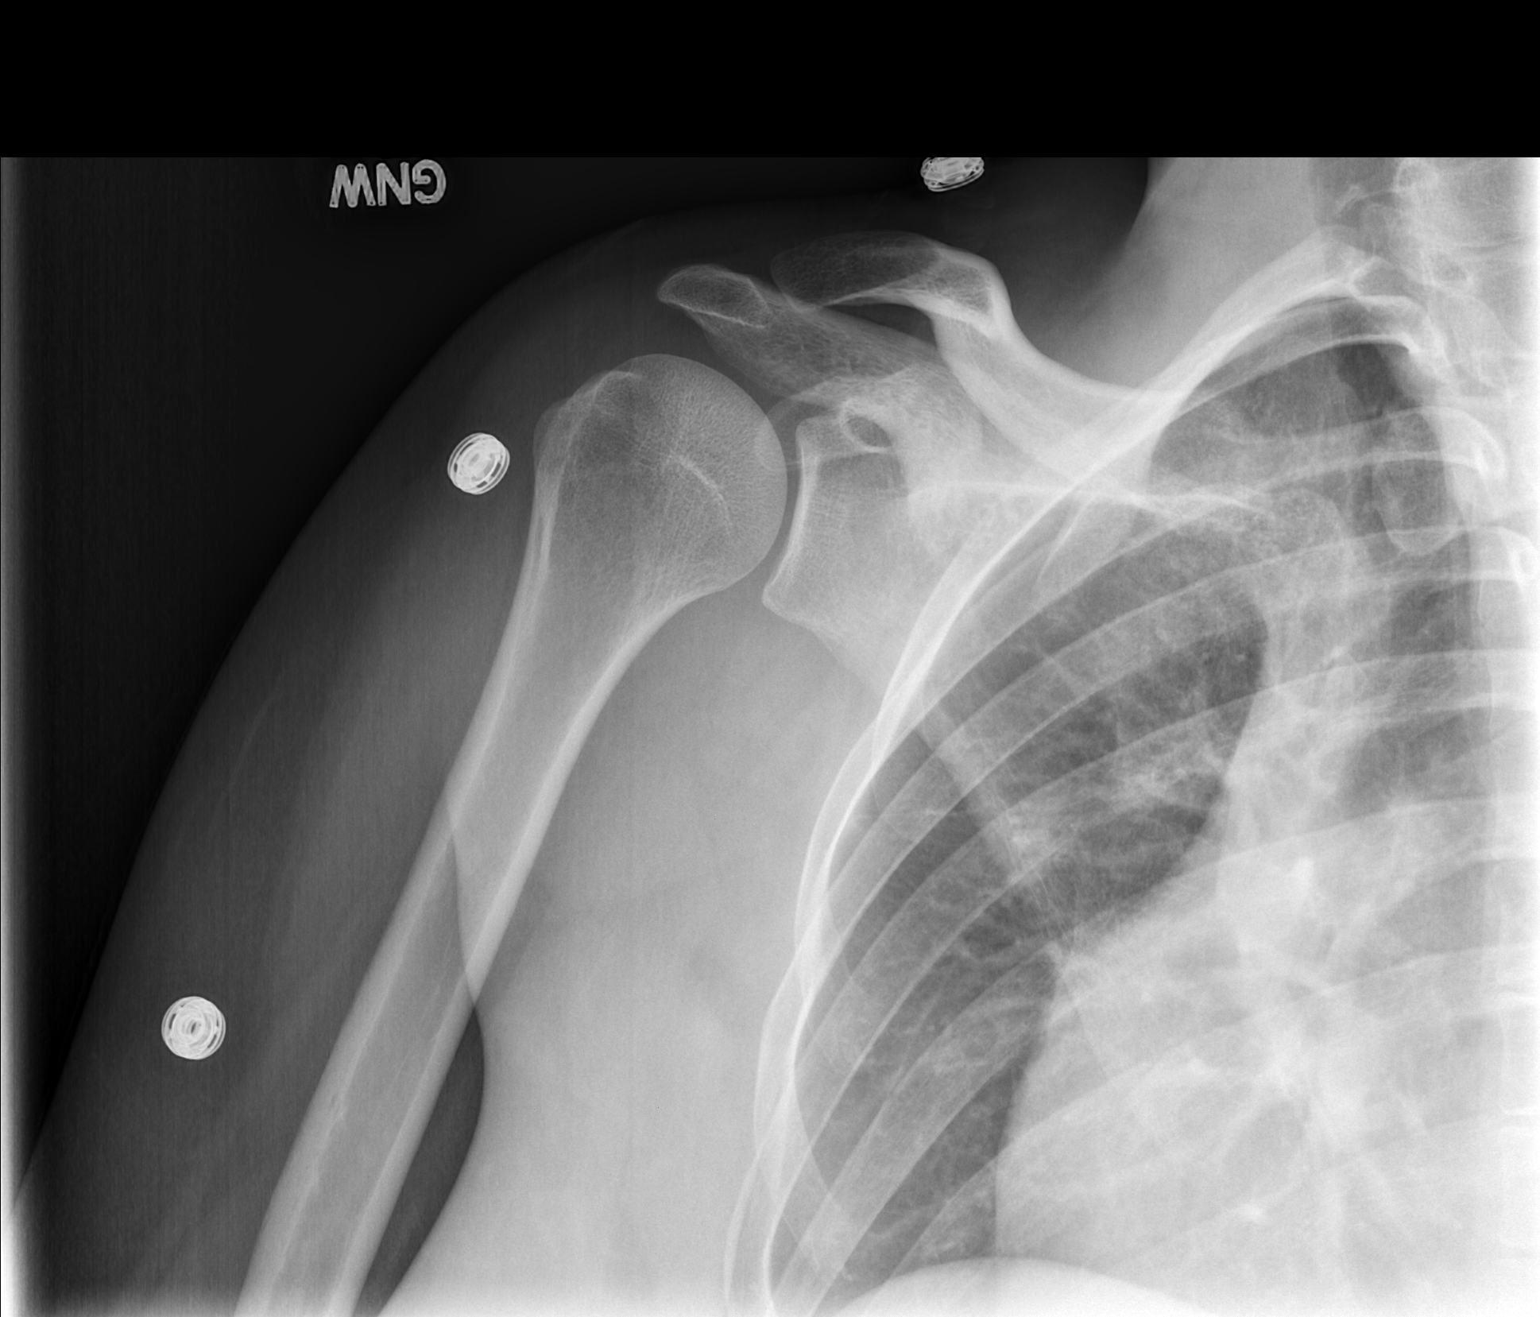

[w shoulder y view right]
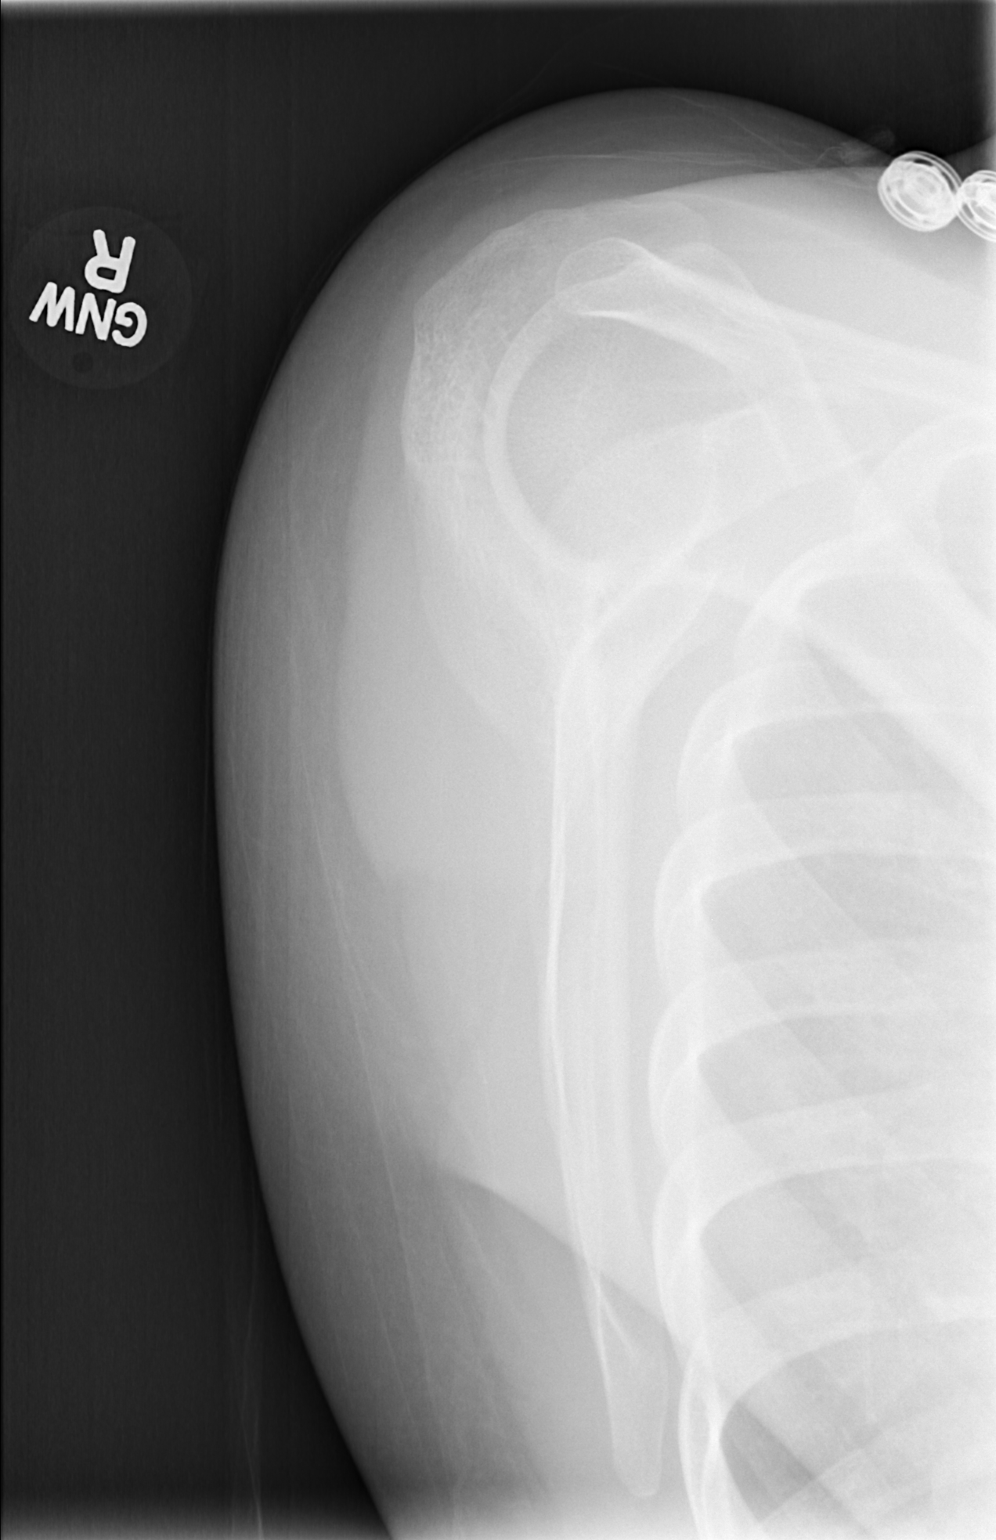

[x shoulder axillary right]
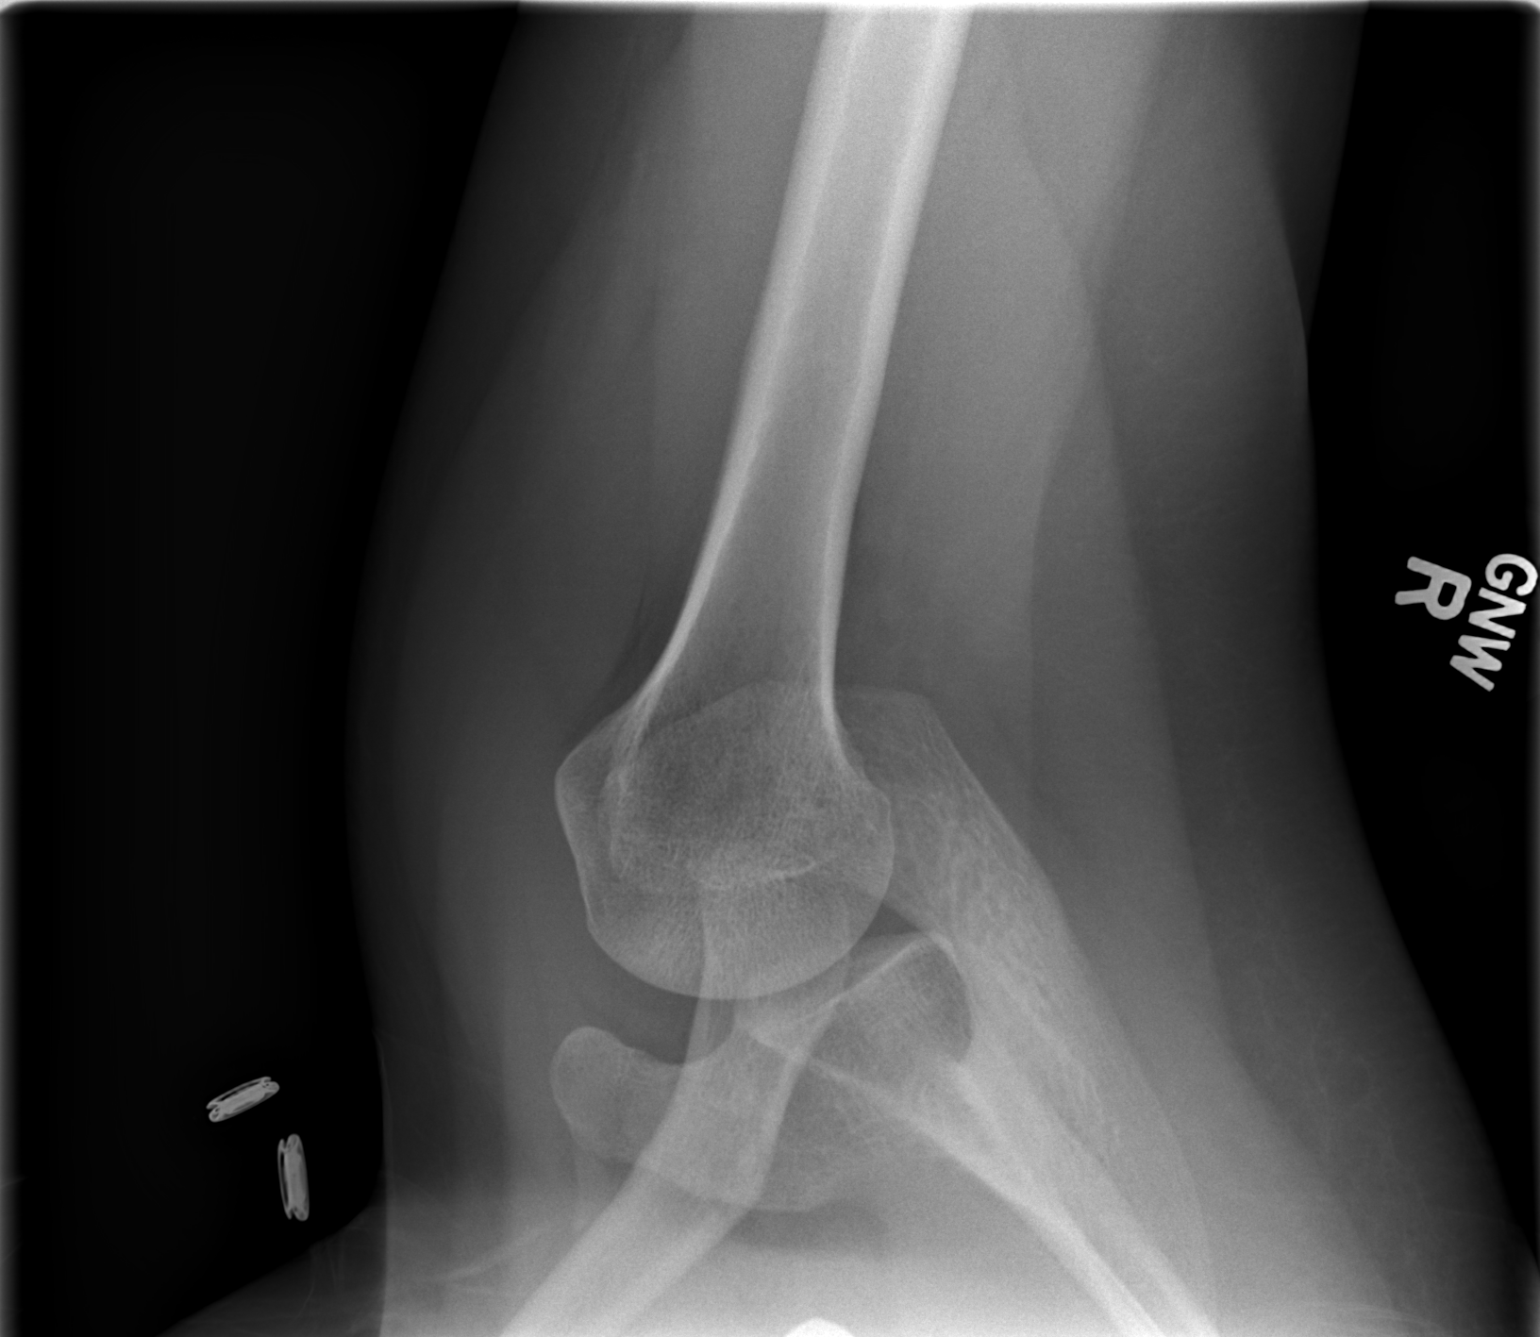

[3 of 3 positions shown; findings below may reference images not displayed]

FINDINGS: There is no evidence of fracture or dislocation. There is no
evidence of arthropathy or other focal bone abnormality. Soft
tissues are unremarkable.
IMPRESSION: Normal examination.

## 2014-11-17 IMAGING — CR DG CHEST 1V
1 series · 1 of 1 positions shown · non-contrast
Comparison: 05/13/2011.

CLINICAL DATA: Shoulder and abdominal pain.

EXAM:
CHEST - 1 VIEW

[w chest pa *]
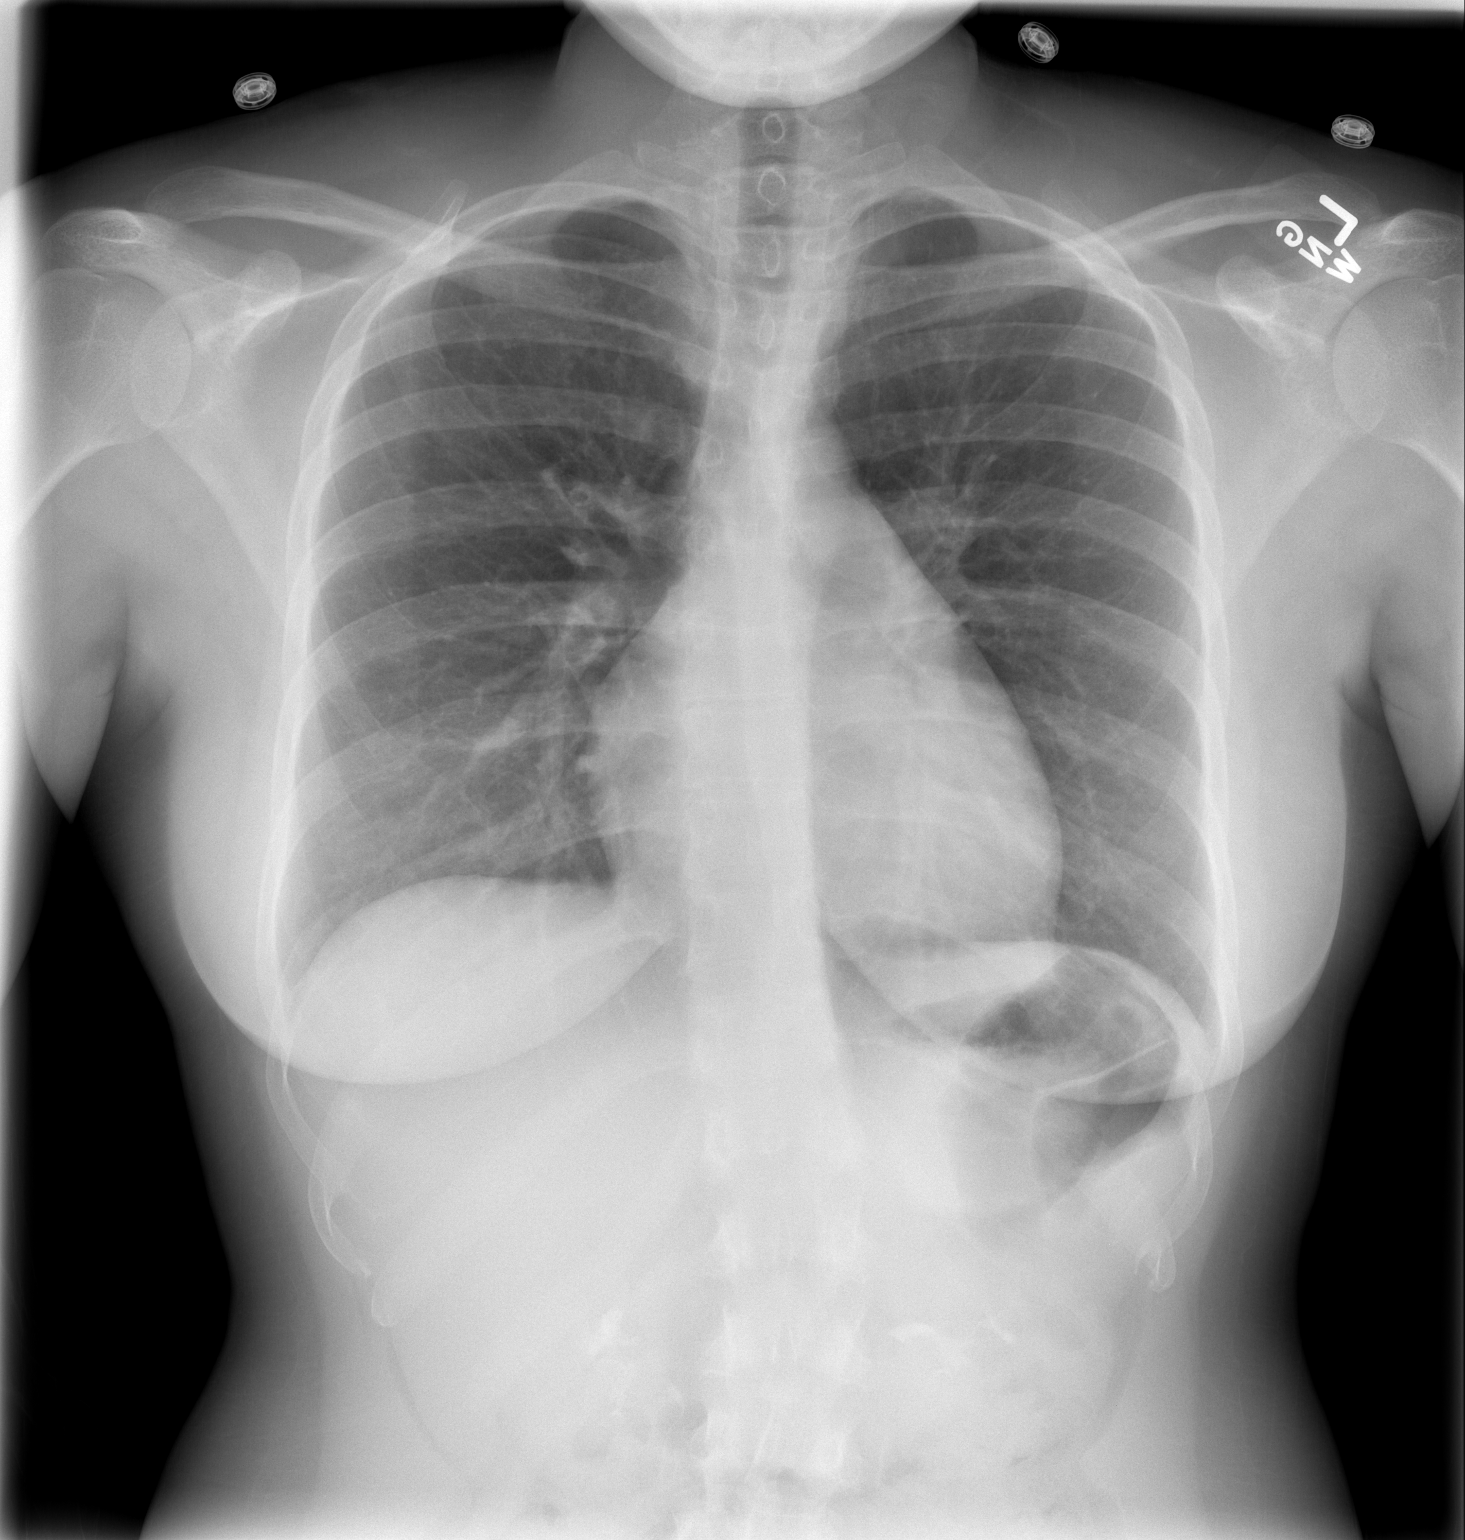

[1 of 1 positions shown; findings below may reference images not displayed]

FINDINGS: Normal sized heart. Clear lungs. Stable mild scoliosis. Excreted
contrast in the renal collecting systems.
IMPRESSION: No acute abnormality.

## 2015-11-23 IMAGING — US IR US GUIDE VASC ACCESS RIGHT
1 series · 3 of 3 positions shown · non-contrast
Comparison: none

CLINICAL DATA: 27-year-old with right lower extremity deep vein
thrombosis. Scheduled for thrombolytics treatment.

[Series 1: ir us guide vasc access right · 3 of 3 slices shown]
[im 1/3]
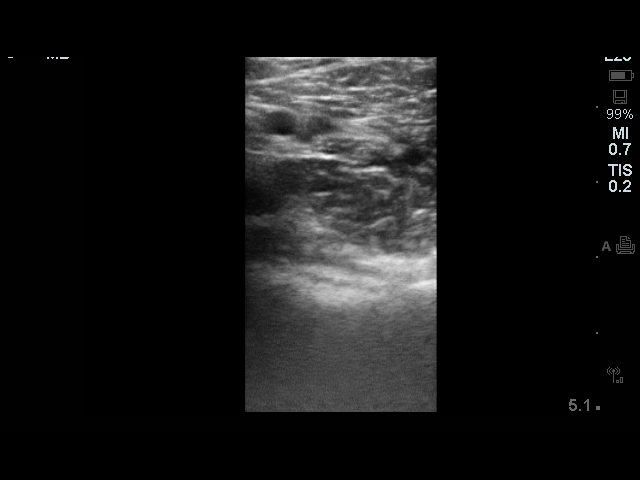
[im 2/3]
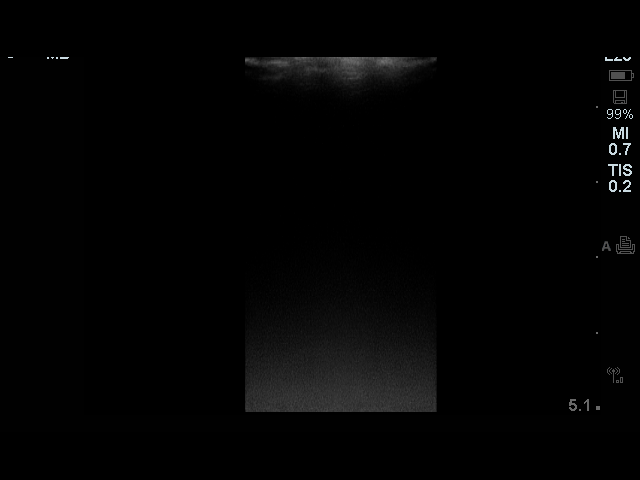
[im 3/3]
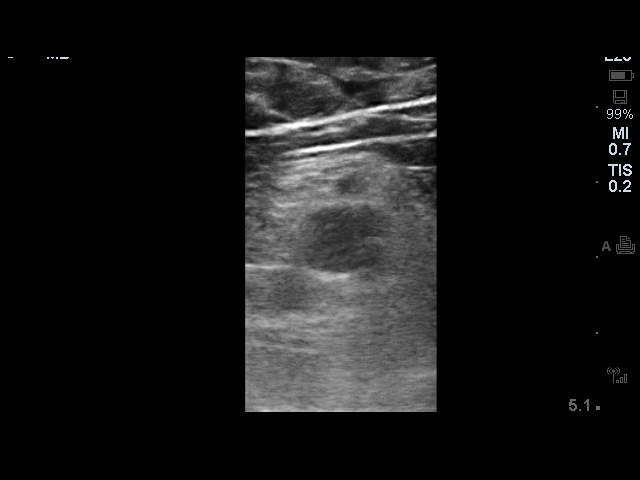

[3 of 3 positions shown; findings below may reference images not displayed]

EXAM:
IR ULTRASOUND GUIDANCE VASC ACCESS RIGHT

FLUOROSCOPY TIME:  None

MEDICATIONS:
2 mg Versed, 50 mcg fentanyl. A radiology nurse monitored the
patient for moderate sedation.

ANESTHESIA/SEDATION:
Moderate sedation time: 17 min

PROCEDURE:
The procedure was explained to the patient. The risks and benefits
of the procedure were discussed and the patient's questions were
addressed. Informed consent was obtained from the patient. Patient
was placed prone. The right popliteal region was prepped and draped
in sterile fashion. Maximal barrier sterile technique was utilized
including caps, mask, sterile gowns, sterile gloves, sterile drape,
hand hygiene and skin antiseptic. Ultrasound demonstrated a
thrombosed right popliteal vein. 1% lidocaine used for local
anesthetic. 21 gauge needle directed into the popliteal vein and a
wire was advanced. At this time, the procedure was canceled.
Procedure was canceled because the patient was on Xarelto and did
not want to start thrombolytics therapy while on this medication.
Needle and wire removed. Bandage placed over the puncture site.
FINDINGS: Thrombosed right popliteal vein.

COMPLICATIONS:
None
IMPRESSION: Ultrasound-guided access of the thrombosed right popliteal vein.
Catheter-directed thrombolytic therapy was postponed.

## 2016-02-12 IMAGING — US IR IVC FILTER RETRIEVAL / S&I /IMG GUID/MOD SED
1 series · 1 of 1 positions shown · non-contrast
Comparison: none

INDICATION: 28-year-old with history of deep vein thrombosis and an IVC filter
was placed prior to knee surgery. The patient is taking
anticoagulation medication again and no longer needs the IVC filter.

[Series 1: ir ivc filter retrieval / s&i /img guid/mod sed · 1 of 1 slices shown]
[im 1/1]
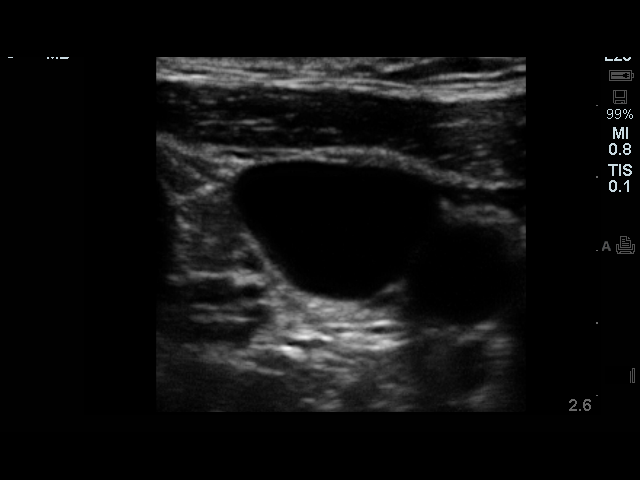

[1 of 1 positions shown; findings below may reference images not displayed]

EXAM:
IVC FILTER RETRIEVAL; IVC VENOGRAM ; ULTRASOUND GUIDANCE FOR
VASCULAR ACCESS

FLUOROSCOPY TIME:  3 min and 18 seconds, 341.7 mGy

MEDICATIONS:
2 mg Versed, 150 mcg fentanyl. A radiology nurse monitored the
patient for moderate sedation.

ANESTHESIA/SEDATION:
Moderate sedation time: 32 minutes

CONTRAST:  90 mL Omnipaque 3 had

PROCEDURE:
The procedure was explained to the patient. The risks and benefits
of the procedure were discussed and the patient's questions were
addressed. Informed consent was obtained from the patient.
Ultrasound demonstrated a patent right internal jugular vein.
Ultrasound images were obtained for documentation. The right side of
the neck was prepped and draped in a sterile fashion. Maximal
barrier sterile technique was utilized including caps, mask, sterile
gowns, sterile gloves, sterile drape, hand hygiene and skin
antiseptic. The skin was anesthetized with 1% lidocaine. A 21 gauge
needle was directed into the vein with ultrasound guidance and a
micropuncture dilator set was placed. A wire was advanced into the
IVC. 5 French vascular sheath was placed. A pigtail catheter was
advanced below the IVC filter. IVC venogram images were obtained.
The sheath and pigtail catheter were exchanged for the filter
retrieval sheath. The filter hook was easily snared. The sheath was
advanced over the shoulders of the filter. The filter was easily
pulled into the sheath. The inner sheath was completely removed with
the filter. Follow-up venogram images were obtained through the
outer sheath. Sheath was removed with manual compression.
FINDINGS: IVC was patent. No significant clot within the filter. Filter was
successfully removed. IVC patent following filter removal.

COMPLICATIONS:
None
IMPRESSION: Successful retrieval of the IVC filter.
